# Patient Record
Sex: Female | Born: 1990 | Race: White | Hispanic: No | Marital: Single | Smoking: Never smoker
Health system: Southern US, Community
[De-identification: ages and names within clinical notes are randomized; demographics above are authoritative.]

## PROBLEM LIST (undated history)

## (undated) DIAGNOSIS — F419 Anxiety disorder, unspecified: Secondary | ICD-10-CM

## (undated) DIAGNOSIS — F1011 Alcohol abuse, in remission: Secondary | ICD-10-CM

## (undated) DIAGNOSIS — F329 Major depressive disorder, single episode, unspecified: Secondary | ICD-10-CM

## (undated) DIAGNOSIS — F32A Depression, unspecified: Secondary | ICD-10-CM

## (undated) DIAGNOSIS — F3181 Bipolar II disorder: Secondary | ICD-10-CM

## (undated) DIAGNOSIS — F1911 Other psychoactive substance abuse, in remission: Secondary | ICD-10-CM

## (undated) HISTORY — DX: Bipolar II disorder: F31.81

## (undated) HISTORY — PX: WISDOM TOOTH EXTRACTION: SHX21

## (undated) HISTORY — DX: Other psychoactive substance abuse, in remission: F19.11

## (undated) HISTORY — DX: Depression, unspecified: F32.A

## (undated) HISTORY — DX: Alcohol abuse, in remission: F10.11

## (undated) HISTORY — DX: Major depressive disorder, single episode, unspecified: F32.9

## (undated) HISTORY — DX: Anxiety disorder, unspecified: F41.9

---

## 2012-06-27 ENCOUNTER — Ambulatory Visit: Payer: Self-pay | Admitting: Women's Health

## 2012-07-11 ENCOUNTER — Encounter: Payer: Self-pay | Admitting: Women's Health

## 2012-07-11 ENCOUNTER — Ambulatory Visit (INDEPENDENT_AMBULATORY_CARE_PROVIDER_SITE_OTHER): Payer: BC Managed Care – PPO | Admitting: Women's Health

## 2012-07-11 VITALS — BP 124/76 | Ht 63.5 in | Wt 186.0 lb

## 2012-07-11 DIAGNOSIS — IMO0001 Reserved for inherently not codable concepts without codable children: Secondary | ICD-10-CM

## 2012-07-11 DIAGNOSIS — F32A Depression, unspecified: Secondary | ICD-10-CM

## 2012-07-11 DIAGNOSIS — F3289 Other specified depressive episodes: Secondary | ICD-10-CM

## 2012-07-11 DIAGNOSIS — N898 Other specified noninflammatory disorders of vagina: Secondary | ICD-10-CM

## 2012-07-11 DIAGNOSIS — E079 Disorder of thyroid, unspecified: Secondary | ICD-10-CM

## 2012-07-11 DIAGNOSIS — Z01419 Encounter for gynecological examination (general) (routine) without abnormal findings: Secondary | ICD-10-CM

## 2012-07-11 DIAGNOSIS — Z87440 Personal history of urinary (tract) infections: Secondary | ICD-10-CM

## 2012-07-11 DIAGNOSIS — N390 Urinary tract infection, site not specified: Secondary | ICD-10-CM

## 2012-07-11 DIAGNOSIS — F329 Major depressive disorder, single episode, unspecified: Secondary | ICD-10-CM | POA: Insufficient documentation

## 2012-07-11 DIAGNOSIS — Z309 Encounter for contraceptive management, unspecified: Secondary | ICD-10-CM

## 2012-07-11 LAB — CBC WITH DIFFERENTIAL/PLATELET
Basophils Absolute: 0 10*3/uL (ref 0.0–0.1)
Basophils Relative: 0 % (ref 0–1)
Eosinophils Absolute: 0.3 10*3/uL (ref 0.0–0.7)
Hemoglobin: 14 g/dL (ref 12.0–15.0)
MCH: 27.9 pg (ref 26.0–34.0)
MCHC: 34.1 g/dL (ref 30.0–36.0)
Monocytes Relative: 11 % (ref 3–12)
Neutrophils Relative %: 43 % (ref 43–77)
RDW: 13.2 % (ref 11.5–15.5)

## 2012-07-11 LAB — URINALYSIS W MICROSCOPIC + REFLEX CULTURE
Bilirubin Urine: NEGATIVE
Glucose, UA: NEGATIVE mg/dL
Hgb urine dipstick: NEGATIVE
Leukocytes, UA: NEGATIVE
Protein, ur: NEGATIVE mg/dL
Urobilinogen, UA: 0.2 mg/dL (ref 0.0–1.0)

## 2012-07-11 LAB — WET PREP FOR TRICH, YEAST, CLUE: Clue Cells Wet Prep HPF POC: NONE SEEN

## 2012-07-11 MED ORDER — FLUCONAZOLE 150 MG PO TABS: 150.0000 mg | ORAL_TABLET | Freq: Once | ORAL | Status: DC

## 2012-07-11 MED ORDER — NORETHINDRONE ACET-ETHINYL EST 1-20 MG-MCG PO TABS: ORAL_TABLET | ORAL | Status: DC

## 2012-07-11 MED ORDER — NITROFURANTOIN MACROCRYSTAL 50 MG PO CAPS: ORAL_CAPSULE | ORAL | Status: DC

## 2012-07-11 NOTE — Patient Instructions (Addendum)
Health Maintenance, 18- to 21-Year-Old SCHOOL PERFORMANCE After high school completion, the young adult may be attending college, technical or vocational school, or entering the military or the work force. SOCIAL AND EMOTIONAL DEVELOPMENT The young adult establishes adult relationships and explores sexual identity. Young adults may be living at home or in a college dorm or apartment. Increasing independence is important with young adults. Throughout adolescence, teens should assume responsibility of their own health care. IMMUNIZATIONS Most young adults should be fully vaccinated. A booster dose of Tdap (tetanus, diphtheria, and pertussis, or "whooping cough"), a dose of meningococcal vaccine to protect against a certain type of bacterial meningitis, hepatitis A, human papillomarvirus (HPV), chickenpox, or measles vaccines may be indicated, if not given at an earlier age. Annual influenza or "flu" vaccination should be considered during flu season.   TESTING Annual screening for vision and hearing problems is recommended. Vision should be screened objectively at least once between 18 and 21 years of age. The young adult may be screened for anemia or tuberculosis. Young adults should have a blood test to check for high cholesterol during this time period. Young adults should be screened for use of alcohol and drugs. If the young adult is sexually active, screening for sexually transmitted infections, pregnancy, or HIV may be performed. Screening for cervical cancer should be performed within 3 years of beginning sexual activity. NUTRITION AND ORAL HEALTH  Adequate calcium intake is important. Consume 3 servings of low-fat milk and dairy products daily. For those who do not drink milk or consume dairy products, calcium enriched foods, such as juice, bread, or cereal, dark, leafy greens, or canned fish are alternate sources of calcium.   Drink plenty of water. Limit fruit juice to 8 to 12 ounces per day.  Avoid sugary beverages or sodas.   Discourage skipping meals, especially breakfast. Teens should eat a good variety of vegetables and fruits, as well as lean meats.   Avoid high fat, high salt, and high sugar foods, such as candy, chips, and cookies.   Encourage young adults to participate in meal planning and preparation.   Eat meals together as a family whenever possible. Encourage conversation at mealtime.   Limit fast food choices and eating out at restaurants.   Brush teeth twice a day and floss.   Schedule dental exams twice a year.  SLEEP Regular sleep habits are important. PHYSICAL, SOCIAL, AND EMOTIONAL DEVELOPMENT  One hour of regular physical activity daily is recommended. Continue to participate in sports.   Encourage young adults to develop their own interests and consider community service or volunteerism.   Provide guidance to the young adult in making decisions about college and work plans.   Make sure that young adults know that they should never be in a situation that makes them uncomfortable, and they should tell partners if they do not want to engage in sexual activity.   Talk to the young adult about body image. Eating disorders may be noted at this time. Young adults may also be concerned about being overweight. Monitor the young adult for weight gain or loss.   Mood disturbances, depression, anxiety, alcoholism, or attention problems may be noted in young adults. Talk to the caregiver if there are concerns about mental illness.   Negotiate limit setting and independent decision making.   Encourage the young adult to handle conflict without physical violence.   Avoid loud noises which may impair hearing.   Limit television and computer time to 2 hours per   day. Individuals who engage in excessive sedentary activity are more likely to become overweight.  RISK BEHAVIORS  Sexually active young adults need to take precautions against pregnancy and sexually  transmitted infections. Talk to young adults about contraception.   Provide a tobacco-free and drug-free environment for the young adult. Talk to the young adult about drug, tobacco, and alcohol use among friends or at friends' homes. Make sure the young adult knows that smoking tobacco or marijuana and taking drugs have health consequences and may impact brain development.   Teach the young adult about appropriate use of over-the-counter or prescription medicines.   Establish guidelines for driving and for riding with friends.   Talk to young adults about the risks of drinking and driving or boating. Encourage the young adult to call you if he or she or friends have been drinking or using drugs.   Remind young adults to wear seat belts at all times in cars and life vests in boats.   Young adults should always wear a properly fitted helmet when they are riding a bicycle.   Use caution with all-terrain vehicles (ATVs) or other motorized vehicles.   Do not keep handguns in the home. (If you do, the gun and ammunition should be locked separately and out of the young adult's access.)   Equip your home with smoke detectors and change the batteries regularly. Make sure all family members know the fire escape plans for your home.   Teach young adults not to swim alone and not to dive in shallow water.   All individuals should wear sunscreen that protects against UVA and UVB light with at least a sun protection factor (SPF) of 30 when out in the sun. This minimizes sun burning.  WHAT'S NEXT? Young adults should visit their pediatrician or family physician yearly. By young adulthood, health care should be transitioned to a family physician or internal medicine specialist. Sexually active females may want to begin annual physical exams with a gynecologist. Document Released: 09/03/2006 Document Revised: 08/31/2011 Document Reviewed: 09/23/2006 ExitCare Patient Information 2013 [employer].    

## 2012-07-11 NOTE — Progress Notes (Signed)
Grace Russell [birth year] [id number]    History:    New patient presents for annual exam.  Regular monthly cycle on generess. Same partner. Gardasil series completed in high school. Currently seeing a counselor and psychiatrist for depression/anxiety. History of recurrent UTIs, associates with intercourse.   Past medical history, past surgical history, family history and social history were all reviewed and documented in the EPIC chart. Attends [employer] doing well academically.   ROS:  A  ROS was performed and pertinent positives and negatives are included in the history.  Exam:  Filed Vitals:   07/11/12 0812  BP: 124/76    General appearance:  Normal Head/Neck:  Normal, without cervical or supraclavicular adenopathy. Thyroid:  Symmetrical, normal in size, without palpable masses or nodularity. Respiratory  Effort:  Normal  Auscultation:  Clear without wheezing or rhonchi Cardiovascular  Auscultation:  Regular rate, without rubs, murmurs or gallops  Edema/varicosities:  Not grossly evident Abdominal  Soft,nontender, without masses, guarding or rebound.  Liver/spleen:  No organomegaly noted  Hernia:  None appreciated  Skin  Inspection:  Grossly normal  Palpation:  Grossly normal Neurologic/psychiatric  Orientation:  Normal with appropriate conversation.  Mood/affect:  Normal  Genitourinary    Breasts: Examined lying and sitting.     Right: Without masses, retractions, discharge or axillary adenopathy.     Left: Without masses, retractions, discharge or axillary adenopathy.   Inguinal/mons:  Normal without inguinal adenopathy  External genitalia:  Normal  BUS/Urethra/Skene's glands:  Normal  Bladder:  Normal  Vagina:  White curdy discharge wet prep positive for yeast  Cervix:  Normal  Uterus:   normal in size, shape and contour.  Midline and mobile  Adnexa/parametria:     Rt: Without masses or tenderness.   Lt: Without masses or tenderness.  Anus and  perineum: Normal   Assessment/Plan:  22 y.o.  S. [employer] G0 for annual exam.    Anxiety/depression-counselor and psychiatrist Contraception management Yeast Recurrent UTIs  Plan: Prescription for Loestrin 1/20 given, reviewed risks of blood clots and strokes. Encouraged condoms until permanent partner. Reviewed it is generic and should costs less, instructed to call if problems with change from generess. Diflucan 150 by mouth times one dose, prescription given these prevention discussed. UA negative, Macrodantin 50 mg with intercourse, prescription, proper use given and reviewed. SBE's, exercise, calcium rich diet, MVI daily, decrease calories for weight loss encouraged. CBC, TSH, UA, Pap screening guidelines reviewed.   Harrington Challenger WHNP, 11:12 AM 07/11/2012

## 2012-07-19 ENCOUNTER — Encounter: Payer: Self-pay | Admitting: Women's Health

## 2012-08-06 ENCOUNTER — Other Ambulatory Visit: Payer: Self-pay

## 2012-12-07 ENCOUNTER — Other Ambulatory Visit: Payer: Self-pay | Admitting: Women's Health

## 2012-12-07 ENCOUNTER — Telehealth: Payer: Self-pay | Admitting: *Deleted

## 2012-12-07 DIAGNOSIS — Z23 Encounter for immunization: Secondary | ICD-10-CM

## 2012-12-07 NOTE — Telephone Encounter (Signed)
Pt will be leaving out of the county [place] 25 days and is requesting a typhoid Rx, [facility] but she will need a rx for this. Pt asked if you would be willing to give? She tried the health department and they don't have it. I'm not sure how to place order in epic. Please advise

## 2012-12-07 NOTE — Telephone Encounter (Signed)
Please call patient, [facility] she will take one tablet every other day for 4 doses, they have in stock. It is a live vaccine, keep refrigerated. Continue on birth control pills while on this. Where is she going?

## 2012-12-07 NOTE — Telephone Encounter (Signed)
Pt informed with the below note, pt will be traveling to [place].

## 2012-12-20 ENCOUNTER — Telehealth: Payer: Self-pay | Admitting: *Deleted

## 2012-12-20 NOTE — Telephone Encounter (Signed)
Typhim Vi.  One capsule on alternate days (day 1, 3, 5, and 7) for a total of 4 doses

## 2012-12-20 NOTE — Telephone Encounter (Signed)
RX CALLED IN SPOKE WITH CINDY, LEFT MESSAGE ON PT VOICEMAIL RX SENT.

## 2012-12-20 NOTE — Telephone Encounter (Signed)
Okay to call in. 

## 2012-12-20 NOTE — Telephone Encounter (Signed)
YOU ARE THE BACK UP MD) SEE TELEPHONE ENCOUNTER 12/07/12. PT CALLED AND SAID RX IS NOT THERE, I CALLED PHARMACY AS WELL AND NO RX IS THERE OR [facility]. PLEASE ADVISE ANY RECOMMENDATIONS.

## 2012-12-20 NOTE — Telephone Encounter (Signed)
NOT SURE WHAT IM CALLING IN? THERE IS NONETHING IN DOCUMENTATION ABOUT THE NAME OF MEDICATION NOR DOSE.  I WILL CALL IN RX, I JUST NEED TO KNOW WHAT TO CALL IN FOR THE PATIENT.

## 2013-04-27 ENCOUNTER — Other Ambulatory Visit: Payer: Self-pay

## 2013-04-30 ENCOUNTER — Other Ambulatory Visit: Payer: Self-pay | Admitting: Women's Health

## 2013-05-01 NOTE — Telephone Encounter (Signed)
Will contact to schedule annual KW

## 2013-07-13 ENCOUNTER — Ambulatory Visit (INDEPENDENT_AMBULATORY_CARE_PROVIDER_SITE_OTHER): Payer: BC Managed Care – PPO | Admitting: Women's Health

## 2013-07-13 ENCOUNTER — Encounter: Payer: Self-pay | Admitting: Women's Health

## 2013-07-13 VITALS — BP 116/76 | Ht 63.75 in | Wt 206.6 lb

## 2013-07-13 DIAGNOSIS — Z01419 Encounter for gynecological examination (general) (routine) without abnormal findings: Secondary | ICD-10-CM

## 2013-07-13 DIAGNOSIS — IMO0001 Reserved for inherently not codable concepts without codable children: Secondary | ICD-10-CM

## 2013-07-13 DIAGNOSIS — R300 Dysuria: Secondary | ICD-10-CM

## 2013-07-13 DIAGNOSIS — Z833 Family history of diabetes mellitus: Secondary | ICD-10-CM

## 2013-07-13 DIAGNOSIS — N390 Urinary tract infection, site not specified: Secondary | ICD-10-CM

## 2013-07-13 DIAGNOSIS — Z309 Encounter for contraceptive management, unspecified: Secondary | ICD-10-CM

## 2013-07-13 DIAGNOSIS — Z113 Encounter for screening for infections with a predominantly sexual mode of transmission: Secondary | ICD-10-CM

## 2013-07-13 DIAGNOSIS — E079 Disorder of thyroid, unspecified: Secondary | ICD-10-CM

## 2013-07-13 LAB — CBC WITH DIFFERENTIAL/PLATELET
BASOS ABS: 0 10*3/uL (ref 0.0–0.1)
BASOS PCT: 0 % (ref 0–1)
EOS ABS: 0.1 10*3/uL (ref 0.0–0.7)
Eosinophils Relative: 2 % (ref 0–5)
HCT: 41.6 % (ref 36.0–46.0)
HEMOGLOBIN: 14 g/dL (ref 12.0–15.0)
Lymphocytes Relative: 39 % (ref 12–46)
Lymphs Abs: 2.1 10*3/uL (ref 0.7–4.0)
MCH: 28.2 pg (ref 26.0–34.0)
MCHC: 33.7 g/dL (ref 30.0–36.0)
MCV: 83.9 fL (ref 78.0–100.0)
MONO ABS: 0.5 10*3/uL (ref 0.1–1.0)
Monocytes Relative: 9 % (ref 3–12)
NEUTROS PCT: 50 % (ref 43–77)
Neutro Abs: 2.8 10*3/uL (ref 1.7–7.7)
Platelets: 317 10*3/uL (ref 150–400)
RBC: 4.96 MIL/uL (ref 3.87–5.11)
RDW: 12.9 % (ref 11.5–15.5)
WBC: 5.5 10*3/uL (ref 4.0–10.5)

## 2013-07-13 LAB — RPR

## 2013-07-13 LAB — HEPATITIS B SURFACE ANTIGEN: Hepatitis B Surface Ag: NEGATIVE

## 2013-07-13 LAB — TSH: TSH: 2.421 u[IU]/mL (ref 0.350–4.500)

## 2013-07-13 LAB — HEPATITIS C ANTIBODY: HCV Ab: NEGATIVE

## 2013-07-13 LAB — HIV ANTIBODY (ROUTINE TESTING W REFLEX): HIV: NONREACTIVE

## 2013-07-13 LAB — GLUCOSE, RANDOM: Glucose, Bld: 87 mg/dL (ref 70–99)

## 2013-07-13 MED ORDER — NITROFURANTOIN MACROCRYSTAL 50 MG PO CAPS: ORAL_CAPSULE | ORAL | Status: DC

## 2013-07-13 MED ORDER — NORETHINDRONE ACET-ETHINYL EST 1-20 MG-MCG PO TABS: ORAL_TABLET | ORAL | Status: DC

## 2013-07-13 NOTE — Patient Instructions (Signed)
Health Maintenance, 65- to 21-Year-Old SCHOOL PERFORMANCE After high school completion, the young adult may be attending college, Hotel manager or vocational school, or entering the [employer] or the work force. SOCIAL AND EMOTIONAL DEVELOPMENT The young adult establishes adult relationships and explores sexual identity. Young adults may be living at home or in a college dorm or apartment. Increasing independence is important with young adults. Throughout these years, young adults should assume responsibility of their own health care. RECOMMENDED IMMUNIZATIONS  Influenza vaccine.  All adults should be immunized every year.  All adults, including pregnant women and people with hives-only allergy to eggs can receive the inactivated influenza (IIV) vaccine.  Adults aged 29 49 years can receive the recombinant influenza (RIV) vaccine. The RIV vaccine does not contain any egg protein.  Tetanus, diphtheria, and acellular pertussis (Td, Tdap) vaccine.  Pregnant women should receive 1 dose of Tdap vaccine during each pregnancy. The dose should be obtained regardless of the length of time since the last dose. Immunization is preferred during the 27th to 36th week of gestation.  An adult who has not previously received Tdap or who does not know his or her vaccine status should receive 1 dose of Tdap. This initial dose should be followed by tetanus and diphtheria toxoids (Td) booster doses every 10 years.  Adults with an unknown or incomplete history of completing a 3-dose immunization series with Td-containing vaccines should begin or complete a primary immunization series including a Tdap dose.  Adults should receive a Td booster every 10 years.  Varicella vaccine.  An adult without evidence of immunity to varicella should receive 2 doses or a second dose if he or she has previously received 1 dose.  Pregnant females who do not have evidence of immunity should receive the first dose after pregnancy.  This first dose should be obtained before leaving the health care facility. The second dose should be obtained 4 8 weeks after the first dose.  Human papillomavirus (HPV) vaccine.  Females aged 55 26 years who have not received the vaccine previously should obtain the 3-dose series.  The vaccine is not recommended for use in pregnant females. However, pregnancy testing is not needed before receiving a dose. If a female is found to be pregnant after receiving a dose, no treatment is needed. In that case, the remaining doses should be delayed until after the pregnancy.  Males aged 30 21 years who have not received the vaccine previously should receive the 3-dose series. Males aged 80 26 years may be immunized.  Immunization is recommended through the age of 52 years for any female who has sex with males and did not get any or all doses earlier.  Immunization is recommended for any person with an immunocompromised condition through the age of 8 years if he or she did not get any or all doses earlier.  During the 3-dose series, the second dose should be obtained 4 8 weeks after the first dose. The third dose should be obtained 24 weeks after the first dose and 16 weeks after the second dose.  Measles, mumps, and rubella (MMR) vaccine.  Adults born in [birth year] or later should have 1 or more doses of MMR vaccine unless there is a contraindication to the vaccine or there is laboratory evidence of immunity to each of the three diseases.  A routine second dose of MMR vaccine should be obtained at least 28 days after the first dose for students attending postsecondary schools, health care workers, or international travelers.  For females of childbearing age, rubella immunity should be determined. If there is no evidence of immunity, females who are not pregnant should be vaccinated. If there is no evidence of immunity, females who are pregnant should delay immunization until after pregnancy.  Pneumococcal  13-valent conjugate (PCV13) vaccine.  When indicated, a person who is uncertain of his or her immunization history and has no record of immunization should receive the PCV13 vaccine.  An adult aged 49 years or older who has certain medical conditions and has not been previously immunized should receive 1 dose of PCV13 vaccine. This PCV13 should be followed with a dose of pneumococcal polysaccharide (PPSV23) vaccine. The PPSV23 vaccine dose should be obtained at least 8 weeks after the dose of PCV13 vaccine.  An adult aged 21 years or older who has certain medical conditions and previously received 1 or more doses of PPSV23 vaccine should receive 1 dose of PCV13. The PCV13 vaccine dose should be obtained 1 or more years after the last PPSV23 vaccine dose.  Pneumococcal polysaccharide (PPSV23) vaccine.  When PCV13 is also indicated, PCV13 should be obtained first.  An adult younger than age 69 years who has certain medical conditions should be immunized.  Any person who resides in a nursing home or long-term care facility should be immunized.  An adult smoker should be immunized.  People with an immunocompromised condition and certain other conditions should receive both PCV13 and PPSV23 vaccines.  People with human immunodeficiency virus (HIV) infection should be immunized as soon as possible after diagnosis.  Immunization during chemotherapy or radiation therapy should be avoided.  Routine use of PPSV23 vaccine is not recommended for American Indians, [place] Natives, or people younger than 65 years unless there are medical conditions that require PPSV23 vaccine.  When indicated, people who have unknown immunization and have no record of immunization should receive PPSV23 vaccine.  One-time revaccination 5 years after the first dose of PPSV23 is recommended for people aged 86 64 years who have chronic kidney failure, nephrotic syndrome, asplenia, or immunocompromised  conditions.  Meningococcal vaccine.  Adults with asplenia or persistent complement component deficiencies should receive 2 doses of quadrivalent meningococcal conjugate (MenACWY-D) vaccine. The doses should be obtained at least 2 months apart.  Microbiologists working with certain meningococcal bacteria, [employer] recruits, people at risk during an outbreak, and people who travel to or live in countries with a high rate of meningitis should be immunized.  A first-year college student up through age 50 years who is living in a residence hall should receive a dose if he or she did not receive a dose on or after his or her 16th birthday.  Adults who have certain high-risk conditions should receive one or more doses of vaccine.  Hepatitis A vaccine.  Adults who wish to be protected from this disease, have certain high-risk conditions, work with hepatitis A-infected animals, work in hepatitis A research labs, or travel to or work in countries with a high rate of hepatitis A should be immunized.  Adults who were previously unvaccinated and who anticipate close contact with an international adoptee during the first 60 days after arrival in the [place] from a country with a high rate of hepatitis A should be immunized.  Hepatitis B vaccine.  Adults who wish to be protected from this disease, have certain high-risk conditions, may be exposed to blood or other infectious body fluids, are household contacts or sex partners of hepatitis B positive people, are clients or workers in  certain care facilities, or travel to or work in countries with a high rate of hepatitis B should be immunized.  Haemophilus influenzae type b (Hib) vaccine.  A previously unvaccinated person with asplenia or sickle cell disease or having a scheduled splenectomy should receive 1 dose of Hib vaccine.  Regardless of previous immunization, a recipient of a hematopoietic stem cell transplant should receive a 3-dose series 6  12 months after his or her successful transplant.  Hib vaccine is not recommended for adults with HIV infection. TESTING Annual screening for vision and hearing problems is recommended. Vision should be screened objectively at least once between 18 21 years of age. The young adult may be screened for anemia or tuberculosis. Young adults should have a blood test to check for high cholesterol during this time period. Young adults should be screened for use of alcohol and drugs. If the young adult is sexually active, screening for sexually transmitted infections, pregnancy, or HIV may be performed.  NUTRITION AND ORAL HEALTH  Adequate calcium intake is important. Consume 3 servings of low-fat milk and dairy products daily. For those who do not drink milk or consume dairy products, calcium enriched foods, such as juice, bread, or cereal, dark, leafy greens, or canned fish are alternate sources of calcium.  Drink plenty of water. Limit fruit juice to 8 12 ounces (240 360 mL) each day. Avoid sugary beverages or sodas.  Discourage skipping meals, especially breakfast. Young adults should eat a good variety of vegetables and fruits, as well as lean meats.  Avoid foods high in fat, salt, or sugar, such as candy, chips, and cookies.  Encourage young adults to participate in meal planning and preparation.  Eat meals together as a family whenever possible. Encourage conversation at mealtime.  Limit fast food choices and eating out at restaurants.  Brush teeth twice a day and floss.  Schedule dental exams twice a year. SLEEP Regular sleep habits are important. PHYSICAL, SOCIAL, AND EMOTIONAL DEVELOPMENT  One hour of regular physical activity daily is recommended. Continue to participate in sports.  Encourage young adults to develop their own interests and consider community service or volunteerism.  Provide guidance to the young adult in making decisions about college and work plans.  Make sure  that young adults know that they should never be in a situation that makes them uncomfortable, and they should tell partners if they do not want to engage in sexual activity.  Talk to the young adult about body image. Eating disorders may be noted at this time. Young adults may also be concerned about being overweight. Monitor the young adult for weight gain or loss.  Mood disturbances, depression, anxiety, alcoholism, or attention problems may be noted in young adults. Talk to the caregiver if there are concerns about mental illness.  Negotiate limit setting and independent decision making.  Encourage the young adult to handle conflict without physical violence.  Avoid loud noises which may impair hearing.  Limit television and computer time to 2 hours each day. Individuals who engage in excessive sedentary activity are more likely to become overweight. RISK BEHAVIORS  Sexually active young adults need to take precautions against pregnancy and sexually transmitted infections. Talk to young adults about contraception.  Provide a tobacco-free and drug-free environment for the young adult. Talk to the young adult about drug, tobacco, and alcohol use among friends or at friend's homes. Make sure the young adult knows that smoking tobacco or marijuana and taking drugs have health consequences and   may impact brain development.  Teach the young adult about appropriate use of over-the-counter or prescription medicines.  Establish guidelines for driving and for riding with friends.  Talk to young adults about the risks of drinking and driving or boating. Encourage the young adult to call you if he or she or friends have been drinking or using drugs.  Remind young adults to wear seat belts at all times in cars and life vests in boats.  Young adults should always wear a properly fitted helmet when they are riding a bicycle.  Use caution with all-terrain vehicles (ATVs) or other motorized  vehicles.  Do not keep handguns in the home. (If you do, the gun and ammunition should be locked separately and out of the young adult's access.)  Equip your home with smoke detectors and change the batteries regularly. Make sure all family members know the fire escape plans for your home.  Teach young adults not to swim alone and not to dive in shallow water.  All individuals should wear sunscreen when out in the sun. This minimizes sunburning. WHAT'S NEXT? Young adults should visit their pediatrician or family physician yearly. By young adulthood, health care should be transitioned to a family physician or internal medicine specialist. Sexually active females may want to begin annual physical exams with a gynecologist. Document Released: 09/03/2006 Document Revised: 10/03/2012 Document Reviewed: 09/23/2006 Exit[employer] Patient Information 2014 [employer].  

## 2013-07-13 NOTE — Progress Notes (Signed)
Grace CourseBettina Russell Dec 30, 1990 [id number]   History:    Presents for annual exam with complaint of urinary odor and occasional dysuria. Denies other urinary/vaginal symptoms/ fever, N/V. Normal monthly cycle/continuous Junel 1/20/new partner.  Requests STD screening. History of UTIs, takes Macrodantin 50 mg postcoital. Gardasil series completed in high school. History of depression/anxiety, not currently on meds or in counseling. 20 pound weight gain from last year.  Past medical history, past surgical history, family history and social history were all reviewed and documented in the EPIC chart. [facility] [employer] [facility] [employer]vocal [employer]. Father diabetes, heart disease. PGF heart disease.  ROS:  A  ROS was performed and pertinent positives and negatives are included.  Exam:  Filed Vitals:   07/13/13 1405  BP: 116/76    General appearance:  Flat affect Thyroid:  Symmetrical, normal in size, without palpable masses or nodularity. Respiratory  Auscultation:  Clear without wheezing or rhonchi Cardiovascular  Auscultation:  Regular rate, without rubs, murmurs or gallops  Edema/varicosities:  Not grossly evident Abdominal  Soft,nontender, without masses, guarding or rebound.  Liver/spleen:  No organomegaly noted  Hernia:  None appreciated  Skin  Inspection:  Grossly normal   Breasts: Examined lying and sitting.     Right: Without masses, retractions, discharge or axillary adenopathy.     Left: Without masses, retractions, discharge or axillary adenopathy. Gentitourinary   Inguinal/mons:  Normal without inguinal adenopathy  External genitalia:  Normal  BUS/Urethra/Skene's glands:  Normal  Vagina:  Normal  Cervix:  Normal  Uterus:   normal in size, shape and contour.  Midline and mobile  Adnexa/parametria:     Rt: Without masses or tenderness.   Lt: Without masses or tenderness.  Anus and perineum: Normal  Digital rectal exam: Normal sphincter tone without palpated masses or  tenderness  Assessment/Plan:  23 y.o. SWF G0 for annual exam with complaint of urinary odor and occasional dysuria.  Normal monthly cycle/continuous Junel 1/20 History frequent UTIs with relief with Macrodantin Depression/anxiety currently on no medication Obesity  Plan: Junel 1/20 given, prescription, proper use given and reviewed risks of blood clots and strokes. Macrodantin 50 mg by mouth postcoital. Prescription proper use given and reviewed. CBC, TSH, glucose, UA, GC/Chlamydia, Hep B, Hep C, HIV, RPR.  Encouraged to find new counselor for anxiety/depression Encouraged condoms until permanent partner. SBE's, exercise, calcium rich diet, MVI daily, decrease calories for weight loss (20 lb weight gain from last year) encouraged. Safe driving, dating reviewed.    Harrington ChallengerYOUNG,Norinne Jeane J [facility] CenterWHNP, 2:45 PM 07/13/2013

## 2013-07-14 LAB — URINALYSIS W MICROSCOPIC + REFLEX CULTURE
BACTERIA UA: NONE SEEN
BILIRUBIN URINE: NEGATIVE
CASTS: NONE SEEN
GLUCOSE, UA: NEGATIVE mg/dL
HGB URINE DIPSTICK: NEGATIVE
KETONES UR: NEGATIVE mg/dL
Leukocytes, UA: NEGATIVE
Nitrite: POSITIVE — AB
PH: 5.5 (ref 5.0–8.0)
PROTEIN: NEGATIVE mg/dL
Specific Gravity, Urine: 1.028 (ref 1.005–1.030)
Urobilinogen, UA: 0.2 mg/dL (ref 0.0–1.0)

## 2013-07-14 LAB — GC/CHLAMYDIA PROBE AMP
CT Probe RNA: NEGATIVE
GC Probe RNA: NEGATIVE

## 2013-07-15 LAB — URINE CULTURE

## 2014-01-03 ENCOUNTER — Ambulatory Visit: Payer: BC Managed Care – PPO | Admitting: Women's Health

## 2014-01-26 ENCOUNTER — Encounter: Payer: Self-pay | Admitting: Women's Health

## 2014-01-26 ENCOUNTER — Ambulatory Visit (INDEPENDENT_AMBULATORY_CARE_PROVIDER_SITE_OTHER): Payer: BC Managed Care – PPO | Admitting: Women's Health

## 2014-01-26 DIAGNOSIS — N3000 Acute cystitis without hematuria: Secondary | ICD-10-CM

## 2014-01-26 DIAGNOSIS — Z113 Encounter for screening for infections with a predominantly sexual mode of transmission: Secondary | ICD-10-CM

## 2014-01-26 DIAGNOSIS — B9689 Other specified bacterial agents as the cause of diseases classified elsewhere: Secondary | ICD-10-CM

## 2014-01-26 DIAGNOSIS — N760 Acute vaginitis: Secondary | ICD-10-CM

## 2014-01-26 DIAGNOSIS — A499 Bacterial infection, unspecified: Secondary | ICD-10-CM

## 2014-01-26 LAB — URINALYSIS W MICROSCOPIC + REFLEX CULTURE
BILIRUBIN URINE: NEGATIVE
Crystals: NONE SEEN
Glucose, UA: NEGATIVE mg/dL
Hgb urine dipstick: NEGATIVE
KETONES UR: NEGATIVE mg/dL
Leukocytes, UA: NEGATIVE
Nitrite: POSITIVE — AB
PROTEIN: NEGATIVE mg/dL
RBC / HPF: NONE SEEN RBC/hpf (ref <3)
Specific Gravity, Urine: 1.02 (ref 1.005–1.030)
UROBILINOGEN UA: 0.2 mg/dL (ref 0.0–1.0)
pH: 6.5 (ref 5.0–8.0)

## 2014-01-26 LAB — WET PREP FOR TRICH, YEAST, CLUE
TRICH WET PREP: NONE SEEN
Yeast Wet Prep HPF POC: NONE SEEN

## 2014-01-26 MED ORDER — METRONIDAZOLE 500 MG PO TABS: 500.0000 mg | ORAL_TABLET | Freq: Two times a day (BID) | ORAL | Status: DC

## 2014-01-26 NOTE — Progress Notes (Signed)
Patient ID: Grace Russell, female   DOB: Jul 28, 1990, 23 y.o.   MRN: [id number] Presents with complaint of questionable UTI,  vaginal discharge with odor. Reports increased urinary frequency, pain, nocturia. Has been using Macrodantin 50 mg after intercourse and states has had 3 UTIs in the past 6 months, none here, treated at urgent care. New partner. Denies abdominal pain, fever. Had been on OC's and stopped.  Exam: Appears well. External genitalia within normal limits, speculum exam scant white adherent discharge noted, wet prep positive for clues, and TNTC bacteria. GC/Chlamydia culture taken. Bimanual no CMT or adnexal fullness or tenderness some tenderness in suprapubic area. UA: Positive for nitrates, 0-2 wbc's, many bacteria.  Bacteria vaginosis Contraception management Urinary symptoms  Plan: Urine culture pending. Flagyl 500 twice daily for 7 days, alcohol precautions reviewed. Contraception options reviewed, Mirena IUD information given and reviewed slight risk for perforation, hemorrhage infection. Return to office for Dr. Audie BoxFontaine to place with cycle will check for insurance coverage.

## 2014-01-26 NOTE — Patient Instructions (Signed)
Levonorgestrel intrauterine device (IUD) What is this medicine? LEVONORGESTREL IUD (LEE voe nor jes trel) is a contraceptive (birth control) device. The device is placed inside the uterus by a healthcare professional. It is used to prevent pregnancy and can also be used to treat heavy bleeding that occurs during your period. Depending on the device, it can be used for 3 to 5 years. This medicine may be used for other purposes; ask your health care provider or pharmacist if you have questions. COMMON BRAND NAME(S): LILETTA, Mirena, Skyla What should I tell my health care provider before I take this medicine? They need to know if you have any of these conditions: -abnormal Pap smear -cancer of the breast, uterus, or cervix -diabetes -endometritis -genital or pelvic infection now or in the past -have more than one sexual partner or your partner has more than one partner -heart disease -history of an ectopic or tubal pregnancy -immune system problems -IUD in place -liver disease or tumor -problems with blood clots or take blood-thinners -use intravenous drugs -uterus of unusual shape -vaginal bleeding that has not been explained -an unusual or allergic reaction to levonorgestrel, other hormones, silicone, or polyethylene, medicines, foods, dyes, or preservatives -pregnant or trying to get pregnant -breast-feeding How should I use this medicine? This device is placed inside the uterus by a health care professional. Talk to your pediatrician regarding the use of this medicine in children. Special care may be needed. Overdosage: If you think you have taken too much of this medicine contact a poison control center or emergency room at once. NOTE: This medicine is only for you. Do not share this medicine with others. What if I miss a dose? This does not apply. What may interact with this medicine? Do not take this medicine with any of the following  medications: -amprenavir -bosentan -fosamprenavir This medicine may also interact with the following medications: -aprepitant -barbiturate medicines for inducing sleep or treating seizures -bexarotene -griseofulvin -medicines to treat seizures like carbamazepine, ethotoin, felbamate, oxcarbazepine, phenytoin, topiramate -modafinil -pioglitazone -rifabutin -rifampin -rifapentine -some medicines to treat HIV infection like atazanavir, indinavir, lopinavir, nelfinavir, tipranavir, ritonavir -St. John's wort -warfarin This list may not describe all possible interactions. Give your health care provider a list of all the medicines, herbs, non-prescription drugs, or dietary supplements you use. Also tell them if you smoke, drink alcohol, or use illegal drugs. Some items may interact with your medicine. What should I watch for while using this medicine? Visit your doctor or health care professional for regular check ups. See your doctor if you or your partner has sexual contact with others, becomes HIV positive, or gets a sexual transmitted disease. This product does not protect you against HIV infection (AIDS) or other sexually transmitted diseases. You can check the placement of the IUD yourself by reaching up to the top of your vagina with clean fingers to feel the threads. Do not pull on the threads. It is a good habit to check placement after each menstrual period. Call your doctor right away if you feel more of the IUD than just the threads or if you cannot feel the threads at all. The IUD may come out by itself. You may become pregnant if the device comes out. If you notice that the IUD has come out use a backup birth control method like condoms and call your health care provider. Using tampons will not change the position of the IUD and are okay to use during your period. What side effects may   I notice from receiving this medicine? Side effects that you should report to your doctor or  health care professional as soon as possible: -allergic reactions like skin rash, itching or hives, swelling of the face, lips, or tongue -fever, flu-like symptoms -genital sores -high blood pressure -no menstrual period for 6 weeks during use -pain, swelling, warmth in the leg -pelvic pain or tenderness -severe or sudden headache -signs of pregnancy -stomach cramping -sudden shortness of breath -trouble with balance, talking, or walking -unusual vaginal bleeding, discharge -yellowing of the eyes or skin Side effects that usually do not require medical attention (report to your doctor or health care professional if they continue or are bothersome): -acne -breast pain -change in sex drive or performance -changes in weight -cramping, dizziness, or faintness while the device is being inserted -headache -irregular menstrual bleeding within first 3 to 6 months of use -nausea This list may not describe all possible side effects. Call your doctor for medical advice about side effects. You may report side effects to FDA at 1-800-FDA-1088. Where should I keep my medicine? This does not apply. NOTE: This sheet is a summary. It may not cover all possible information. If you have questions about this medicine, talk to your doctor, pharmacist, or health care provider.  2015, [employer]vier/Gold Standard. (2011-07-09 13:54:04)  

## 2014-01-27 LAB — RPR

## 2014-01-27 LAB — GC/CHLAMYDIA PROBE AMP
CT PROBE, AMP APTIMA: NEGATIVE
GC PROBE AMP APTIMA: NEGATIVE

## 2014-01-27 LAB — HEPATITIS B SURFACE ANTIGEN: Hepatitis B Surface Ag: NEGATIVE

## 2014-01-27 LAB — HIV ANTIBODY (ROUTINE TESTING W REFLEX): HIV: NONREACTIVE

## 2014-01-27 LAB — HEPATITIS C ANTIBODY: HCV AB: NEGATIVE

## 2014-01-28 LAB — URINE CULTURE: Colony Count: >=100000

## 2014-01-29 ENCOUNTER — Other Ambulatory Visit: Payer: Self-pay | Admitting: Women's Health

## 2014-01-29 DIAGNOSIS — N3001 Acute cystitis with hematuria: Secondary | ICD-10-CM

## 2014-01-29 MED ORDER — CIPROFLOXACIN HCL 250 MG PO TABS: 250.0000 mg | ORAL_TABLET | Freq: Two times a day (BID) | ORAL | Status: DC

## 2014-01-31 ENCOUNTER — Other Ambulatory Visit: Payer: Self-pay | Admitting: Gynecology

## 2014-01-31 ENCOUNTER — Telehealth: Payer: Self-pay | Admitting: Gynecology

## 2014-01-31 DIAGNOSIS — Z3049 Encounter for surveillance of other contraceptives: Secondary | ICD-10-CM

## 2014-01-31 MED ORDER — LEVONORGESTREL 20 MCG/24HR IU IUD: INTRAUTERINE_SYSTEM | Freq: Once | INTRAUTERINE | Status: AC

## 2014-01-31 NOTE — Telephone Encounter (Signed)
01/31/14-LM VM for pt [facility] Mirena & insertion under her $50.00 copay/wl

## 2014-02-02 ENCOUNTER — Ambulatory Visit: Payer: BC Managed Care – PPO | Admitting: Gynecology

## 2014-02-02 ENCOUNTER — Telehealth: Payer: Self-pay | Admitting: *Deleted

## 2014-02-02 NOTE — Telephone Encounter (Signed)
Yes until she started as her menstrual cycle

## 2014-02-02 NOTE — Telephone Encounter (Signed)
Pt scheduled to have Mirena Iud placed today at 2:00 pm. Cycle has not yet started, stopped taking BCP back in April, uses condoms but condom broke several weeks ago. LMP 12/29/13. Would you like patient to reschedule appointment? Please advise

## 2014-02-02 NOTE — Telephone Encounter (Signed)
Pt will be informed. 

## 2014-06-01 ENCOUNTER — Telehealth: Payer: Self-pay | Admitting: *Deleted

## 2014-06-01 DIAGNOSIS — N390 Urinary tract infection, site not specified: Secondary | ICD-10-CM

## 2014-06-01 MED ORDER — NITROFURANTOIN MACROCRYSTAL 50 MG PO CAPS: ORAL_CAPSULE | ORAL | Status: DC

## 2014-06-01 NOTE — Telephone Encounter (Signed)
Okay, Macrodantin 50 mg as needed with intercourse #30 no refills.

## 2014-06-01 NOTE — Telephone Encounter (Signed)
Pt called requesting refill on macrodantin 50 mg PRN postcoital. Please advise

## 2014-06-01 NOTE — Telephone Encounter (Signed)
rx sent, pt aware 

## 2014-07-20 ENCOUNTER — Encounter: Payer: BC Managed Care – PPO | Admitting: Women's Health

## 2015-03-27 ENCOUNTER — Encounter: Payer: Self-pay | Admitting: Women's Health

## 2015-03-27 ENCOUNTER — Ambulatory Visit (INDEPENDENT_AMBULATORY_CARE_PROVIDER_SITE_OTHER): Payer: Commercial Managed Care - HMO | Admitting: Women's Health

## 2015-03-27 ENCOUNTER — Other Ambulatory Visit (HOSPITAL_COMMUNITY)
Admission: RE | Admit: 2015-03-27 | Discharge: 2015-03-27 | Disposition: A | Discharge status: Home / Self Care | Payer: Self-pay | Source: Ambulatory Visit | Attending: Gynecology | Admitting: Gynecology

## 2015-03-27 VITALS — BP 120/78 | Ht 65.0 in | Wt 235.0 lb

## 2015-03-27 DIAGNOSIS — Z975 Presence of (intrauterine) contraceptive device: Secondary | ICD-10-CM | POA: Diagnosis not present

## 2015-03-27 DIAGNOSIS — N390 Urinary tract infection, site not specified: Secondary | ICD-10-CM | POA: Diagnosis not present

## 2015-03-27 DIAGNOSIS — Z113 Encounter for screening for infections with a predominantly sexual mode of transmission: Secondary | ICD-10-CM | POA: Diagnosis not present

## 2015-03-27 DIAGNOSIS — Z01419 Encounter for gynecological examination (general) (routine) without abnormal findings: Secondary | ICD-10-CM

## 2015-03-27 LAB — CBC WITH DIFFERENTIAL/PLATELET
Basophils Absolute: 0 10*3/uL (ref 0.0–0.1)
Basophils Relative: 0 % (ref 0–1)
EOS PCT: 5 % (ref 0–5)
Eosinophils Absolute: 0.4 10*3/uL (ref 0.0–0.7)
HEMATOCRIT: 41.4 % (ref 36.0–46.0)
Hemoglobin: 14 g/dL (ref 12.0–15.0)
LYMPHS PCT: 27 % (ref 12–46)
Lymphs Abs: 1.9 10*3/uL (ref 0.7–4.0)
MCH: 28.6 pg (ref 26.0–34.0)
MCHC: 33.8 g/dL (ref 30.0–36.0)
MCV: 84.7 fL (ref 78.0–100.0)
MONO ABS: 0.8 10*3/uL (ref 0.1–1.0)
MPV: 9.3 fL (ref 8.6–12.4)
Monocytes Relative: 11 % (ref 3–12)
Neutro Abs: 4.1 10*3/uL (ref 1.7–7.7)
Neutrophils Relative %: 57 % (ref 43–77)
Platelets: 371 10*3/uL (ref 150–400)
RBC: 4.89 MIL/uL (ref 3.87–5.11)
RDW: 13.1 % (ref 11.5–15.5)
WBC: 7.2 10*3/uL (ref 4.0–10.5)

## 2015-03-27 LAB — TSH: TSH: 2.575 u[IU]/mL (ref 0.350–4.500)

## 2015-03-27 LAB — GLUCOSE, RANDOM: Glucose, Bld: 74 mg/dL (ref 65–99)

## 2015-03-27 MED ORDER — NITROFURANTOIN MACROCRYSTAL 50 MG PO CAPS: ORAL_CAPSULE | ORAL | Status: DC

## 2015-03-27 NOTE — Progress Notes (Signed)
Patient ID: Grace Russell, female   DOB: 1991-01-25, 24 y.o.   MRN: [id number] Tayonna Bacha [birth year] [id number]    History:    Presents for annual exam.  She has concern that something might be wrong with her Mirena because she has had two episodes of spotting, [facility]  She is newly engaged and uses condoms intermittently.  Denies discharge, itching, and odor.  Does not exercise and sometimes eats poorly.  Has completed gardasil series.  Is struggling with bipolar disorder and having trouble finding a psychiatrist in the area.  She is feeling very depressed lately, but has been abstaining from alcohol. History of suicide attempt with Adderall and alcohol combination.  Past medical history, past surgical history, family history and social history were all reviewed and documented in the EPIC chart.   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  She met her fiance on vacation in [place] and he was living in [place], but his visa expired and he had to go back to [place].  Her father has diabetes, heart failure, and is awaiting a heart transplant.  Mother has unknown psych history and both parents have a history of substance abuse.    ROS:  A ROS was performed and pertinent positives and negatives are included.  Exam:  Filed Vitals:   03/27/15 1402  BP: 120/78    General appearance:  Normal, flat affect Thyroid:  Symmetrical, normal in size, without palpable masses or nodularity. Respiratory  Auscultation:  Clear without wheezing or rhonchi Cardiovascular  Auscultation:  Regular rate, without rubs, murmurs or gallops  Edema/varicosities:  Not grossly evident Abdominal  Soft,nontender, without masses, guarding or rebound.  Liver/spleen:  No organomegaly noted  Hernia:  None appreciated  Skin  Inspection:  Grossly normal   Breasts: Examined lying and sitting.     Right: Without masses, retractions, discharge or  axillary adenopathy.     Left: Without masses, retractions, discharge or axillary adenopathy. Gentitourinary   Inguinal/mons:  Normal without inguinal adenopathy  External genitalia:  Normal  BUS/Urethra/Skene's glands:  Normal  Vagina:  Normal  Cervix:  Normal IUD strings visible  Uterus:  Normal in size, shape and contour.  Midline and mobile  Adnexa/parametria:     Rt: Without masses or tenderness.   Lt: Without masses or tenderness.  Anus and perineum: Normal  Digital rectal exam: Normal sphincter tone without palpated masses or tenderness  Assessment/Plan:  24 y.o. Engaged HF GOPO for annual exam.  Mirena in place with minimal spotting STI screen History frequent UTIs- relief with Macrodantin postcoitally Bipolar II, on Lithium- looking for a psychiatrist to manage  Plan:  Pap Smear, UA, GC and Chlamydia probe, RPR, HIV, Hep B and C, CBC, Glucose, and TSH today.  Prescribed macrodantin 84m for postcoital.  Given 3 psychiatrists to try to establish care, discussed going to the hospital if she needs to.  Encouraged condoms until permanent partner. SBE's, exercise, calcium rich diet, MVI daily, decrease calories for weight loss encouraged.  Reviewed irregular spotting can be normal with Mirena IUD.     YHuel CoteWHNP, 3:02 PM 03/27/2015

## 2015-03-27 NOTE — Patient Instructions (Signed)
Health Maintenance, Female Adopting a healthy lifestyle and getting preventive care can go a long way to promote health and wellness. Talk with your health care provider about what schedule of regular examinations is right for you. This is a good chance for you to check in with your provider about disease prevention and staying healthy. In between checkups, there are plenty of things you can do on your own. Experts have done a lot of research about which lifestyle changes and preventive measures are most likely to keep you healthy. Ask your health care provider for more information. WEIGHT AND DIET  Eat a healthy diet  Be sure to include plenty of vegetables, fruits, low-fat dairy products, and lean protein.  Do not eat a lot of foods high in solid fats, added sugars, or salt.  Get regular exercise. This is one of the most important things you can do for your health.  Most adults should exercise for at least 150 minutes each week. The exercise should increase your heart rate and make you sweat (moderate-intensity exercise).  Most adults should also do strengthening exercises at least twice a week. This is in addition to the moderate-intensity exercise.  Maintain a healthy weight  Body mass index (BMI) is a measurement that can be used to identify possible weight problems. It estimates body fat based on height and weight. Your health care provider can help determine your BMI and help you achieve or maintain a healthy weight.  For females 20 years of age and older:   A BMI below 18.5 is considered underweight.  A BMI of 18.5 to 24.9 is normal.  A BMI of 25 to 29.9 is considered overweight.  A BMI of 30 and above is considered obese.  Watch levels of cholesterol and blood lipids  You should start having your blood tested for lipids and cholesterol at 22 years of age, then have this test every 5 years.  You may need to have your cholesterol levels checked more often if:  Your lipid  or cholesterol levels are high.  You are older than 22 years of age.  You are at high risk for heart disease.  CANCER SCREENING   Lung Cancer  Lung cancer screening is recommended for adults 55-80 years old who are at high risk for lung cancer because of a history of smoking.  A yearly low-dose CT scan of the lungs is recommended for people who:  Currently smoke.  Have quit within the past 15 years.  Have at least a 30-pack-year history of smoking. A pack year is smoking an average of one pack of cigarettes a day for 1 year.  Yearly screening should continue until it has been 15 years since you quit.  Yearly screening should stop if you develop a health problem that would prevent you from having lung cancer treatment.  Breast Cancer  Practice breast self-awareness. This means understanding how your breasts normally appear and feel.  It also means doing regular breast self-exams. Let your health care provider know about any changes, no matter how small.  If you are in your 20s or 30s, you should have a clinical breast exam (CBE) by a health care provider every 1-3 years as part of a regular health exam.  If you are 40 or older, have a CBE every year. Also consider having a breast X-ray (mammogram) every year.  If you have a family history of breast cancer, talk to your health care provider about genetic screening.  If you   are at high risk for breast cancer, talk to your health care provider about having an MRI and a mammogram every year.  Breast cancer gene (BRCA) assessment is recommended for women who have family members with BRCA-related cancers. BRCA-related cancers include:  Breast.  Ovarian.  Tubal.  Peritoneal cancers.  Results of the assessment will determine the need for genetic counseling and BRCA1 and BRCA2 testing. Cervical Cancer Your health care provider may recommend that you be screened regularly for cancer of the pelvic organs (ovaries, uterus, and  vagina). This screening involves a pelvic examination, including checking for microscopic changes to the surface of your cervix (Pap test). You may be encouraged to have this screening done every 3 years, beginning at age 21.  For women ages 30-65, health care providers may recommend pelvic exams and Pap testing every 3 years, or they may recommend the Pap and pelvic exam, combined with testing for human papilloma virus (HPV), every 5 years. Some types of HPV increase your risk of cervical cancer. Testing for HPV may also be done on women of any age with unclear Pap test results.  Other health care providers may not recommend any screening for nonpregnant women who are considered low risk for pelvic cancer and who do not have symptoms. Ask your health care provider if a screening pelvic exam is right for you.  If you have had past treatment for cervical cancer or a condition that could lead to cancer, you need Pap tests and screening for cancer for at least 20 years after your treatment. If Pap tests have been discontinued, your risk factors (such as having a new sexual partner) need to be reassessed to determine if screening should resume. Some women have medical problems that increase the chance of getting cervical cancer. In these cases, your health care provider may recommend more frequent screening and Pap tests. Colorectal Cancer  This type of cancer can be detected and often prevented.  Routine colorectal cancer screening usually begins at 22 years of age and continues through 22 years of age.  Your health care provider may recommend screening at an earlier age if you have risk factors for colon cancer.  Your health care provider may also recommend using home test kits to check for hidden blood in the stool.  A small camera at the end of a tube can be used to examine your colon directly (sigmoidoscopy or colonoscopy). This is done to check for the earliest forms of colorectal  cancer.  Routine screening usually begins at age 50.  Direct examination of the colon should be repeated every 5-10 years through 22 years of age. However, you may need to be screened more often if early forms of precancerous polyps or small growths are found. Skin Cancer  Check your skin from head to toe regularly.  Tell your health care provider about any new moles or changes in moles, especially if there is a change in a mole's shape or color.  Also tell your health care provider if you have a mole that is larger than the size of a pencil eraser.  Always use sunscreen. Apply sunscreen liberally and repeatedly throughout the day.  Protect yourself by wearing long sleeves, pants, a wide-brimmed hat, and sunglasses whenever you are outside. HEART DISEASE, DIABETES, AND HIGH BLOOD PRESSURE   High blood pressure causes heart disease and increases the risk of stroke. High blood pressure is more likely to develop in:  People who have blood pressure in the high end   of the normal range (130-139/85-89 mm Hg).  People who are overweight or obese.  People who are African American.  If you are 38-23 years of age, have your blood pressure checked every 3-5 years. If you are 61 years of age or older, have your blood pressure checked every year. You should have your blood pressure measured twice--once when you are at a hospital or clinic, and once when you are not at a hospital or clinic. Record the average of the two measurements. To check your blood pressure when you are not at a hospital or clinic, you can use:  An automated blood pressure machine at a pharmacy.  A home blood pressure monitor.  If you are between 45 years and 39 years old, ask your health care provider if you should take aspirin to prevent strokes.  Have regular diabetes screenings. This involves taking a blood sample to check your fasting blood sugar level.  If you are at a normal weight and have a low risk for diabetes,  have this test once every three years after 22 years of age.  If you are overweight and have a high risk for diabetes, consider being tested at a younger age or more often. PREVENTING INFECTION  Hepatitis B  If you have a higher risk for hepatitis B, you should be screened for this virus. You are considered at high risk for hepatitis B if:  You were born in a country where hepatitis B is common. Ask your health care provider which countries are considered high risk.  Your parents were born in a high-risk country, and you have not been immunized against hepatitis B (hepatitis B vaccine).  You have HIV or AIDS.  You use needles to inject street drugs.  You live with someone who has hepatitis B.  You have had sex with someone who has hepatitis B.  You get hemodialysis treatment.  You take certain medicines for conditions, including cancer, organ transplantation, and autoimmune conditions. Hepatitis C  Blood testing is recommended for:  Everyone born from [birth year] through 1963.  Anyone with known risk factors for hepatitis C. Sexually transmitted infections (STIs)  You should be screened for sexually transmitted infections (STIs) including gonorrhea and chlamydia if:  You are sexually active and are younger than 22 years of age.  You are older than 22 years of age and your health care provider tells you that you are at risk for this type of infection.  Your sexual activity has changed since you were last screened and you are at an increased risk for chlamydia or gonorrhea. Ask your health care provider if you are at risk.  If you do not have HIV, but are at risk, it may be recommended that you take a prescription medicine daily to prevent HIV infection. This is called pre-exposure prophylaxis (PrEP). You are considered at risk if:  You are sexually active and do not regularly use condoms or know the HIV status of your partner(s).  You take drugs by injection.  You are sexually  active with a partner who has HIV. Talk with your health care provider about whether you are at high risk of being infected with HIV. If you choose to begin PrEP, you should first be tested for HIV. You should then be tested every 3 months for as long as you are taking PrEP.  PREGNANCY   If you are premenopausal and you may become pregnant, ask your health care provider about preconception counseling.  If you may  become pregnant, take 400 to 800 micrograms (mcg) of folic acid every day.  If you want to prevent pregnancy, talk to your health care provider about birth control (contraception). OSTEOPOROSIS AND MENOPAUSE   Osteoporosis is a disease in which the bones lose minerals and strength with aging. This can result in serious bone fractures. Your risk for osteoporosis can be identified using a bone density scan.  If you are 64 years of age or older, or if you are at risk for osteoporosis and fractures, ask your health care provider if you should be screened.  Ask your health care provider whether you should take a calcium or vitamin D supplement to lower your risk for osteoporosis.  Menopause may have certain physical symptoms and risks.  Hormone replacement therapy may reduce some of these symptoms and risks. Talk to your health care provider about whether hormone replacement therapy is right for you.  HOME CARE INSTRUCTIONS   Schedule regular health, dental, and eye exams.  Stay current with your immunizations.   Do not use any tobacco products including cigarettes, chewing tobacco, or electronic cigarettes.  If you are pregnant, do not drink alcohol.  If you are breastfeeding, limit how much and how often you drink alcohol.  Limit alcohol intake to no more than 1 drink per day for nonpregnant women. One drink equals 12 ounces of beer, 5 ounces of wine, or 1 ounces of hard liquor.  Do not use street drugs.  Do not share needles.  Ask your health care provider for help if  you need support or information about quitting drugs.  Tell your health care provider if you often feel depressed.  Tell your health care provider if you have ever been abused or do not feel safe at home.   This information is not intended to replace advice given to you by your health care provider. Make sure you discuss any questions you have with your health care provider.   Document Released: 12/22/2010 Document Revised: 06/29/2014 Document Reviewed: 05/10/2013 Elsevier Interactive Patient Education 2016 [place] on a Budget There are many ways to save money at the grocery store and continue to eat healthy. You can be successful if you plan your meals according to your budget, purchase according to your budget and grocery list, and prepare food yourself.  How can I buy more food on a limited budget? Plan  Plan meals and snacks according to a grocery list and budget you create.  Look for recipes where you can cook once and make enough food for two meals.  Include meals that will "stretch" more expensive foods such as stews, casseroles, and stir-fry dishes.  Make a grocery list and make sure to bring it with you to the store. If you have a smart phone, you could use your phone to create your shopping list. Purchase  When grocery shopping, buy only the items on your grocery list and go only to the areas of the store that have the items on your list. Prepare  Some meal items can be prepared in advance. Pre-cook on days when you have extra time.  Make extra food (such as by doubling recipes) and freeze the extras in meal-sized containers or in individual portions for fast meals and snacks.  Use leftovers in your meal plan for the week.  Try some meatless meals or try "no cook" meals like salads.  When you come home from the grocery store, wash and prepare your fruits and vegetables so  they are ready to use and eat. This will help reduce food waste. How can I buy  more food on a limited budget?  Try these tips the next time you go shopping:   [place] store brands or generic brands.  Use coupons only for foods and brands you normally buy. Avoid buying items you wouldn't normally buy simply because they are on sale.  Check online and in newspapers for weekly deals.  Buy healthy items from the bulk bins when available, such as herbs, spices, flours, pastas, nuts, and dried fruit.  Buy fruits and vegetables that are in season. Prices are usually lower on in-season produce.  Compare and contrast different items. You can do this by looking at the unit price on the price tag. Use it to compare different brands and sizes to find out which item is the best deal.  Choose naturally low-cost healthy items, such as carrots, potatoes, apples, bananas, and oranges. Dried or canned beans are a low-cost protein source.  Buy in bulk and freeze extra food. Items you can buy in bulk include meats, fish, poultry, frozen fruits, and frozen vegetables.  Limit the purchase of prepared or "ready-to-eat" foods, such as pre-cut fruits and vegetables and pre-made salads.  If possible, shop around to discover which grocery store offers the best prices. Some stores charge much more than other stores for the same items.  Do not shop when you are hungry. If you shop while hungry, It may be hard to stick to your list and budget.  Stick to your list and resist impulse buys. Treat your list as your official plan for the week.  Buy a variety of vegetables and fruit by purchasing fresh, frozen, and canned items.  Look beyond eye level. Foods at eye level (adult or child eye level) are more expensive. Look at the top and bottom shelves for deals.  Be efficient with your time when shopping. The more time you spend at the store, the more money you are likely to spend.  Consider other retailers such as dollar stores, larger [employer], local fruit and vegetable stands, and farmers  markets. What are some tips for less expensive food substitutions? When choosing more expensive foods like meats and dairy, try these tips to save money:  Choose cheaper cuts of meat, such as bone-in chicken thighs and drumsticks instead skinless and boneless chicken. When you are ready to prepare the chicken, you can remove the skin yourself to make it healthier.  Choose lean meats like chicken or [place]. When choosing ground beef, make sure it is lean ground beef (92% lean, 8% fat). If you do buy a fattier ground beef, drain the fat before eating.  Buy dried beans and peas, such as lentils, split peas, or kidney beans.  For seafood, choose canned tuna, salmon, or sardines.  Eggs are a low-cost source of protein.  Buy the larger tubs of yogurt instead of individual-sized containers.  Choose water instead of sodas and other sweetened beverages.  Skip buying chips, cookies, and other "junk food". These items are usually expensive, high in calories, and low in nutritional value. How can I prepare the foods I buy in the healthiest way? Practice these tips for cooking foods in the healthiest way to reduce excess fat and calorie intake:  Steam, saute, grill, or bake foods instead of frying them.  Make sure half your plate is filled with fruits or vegetables. Choose from fresh, frozen, or canned fruits and vegetables. If eating canned,  remember to rinse them before eating. This will remove any excess salt added for packaging.  Trim all fat from meat before cooking. Remove the skin from chicken or [place].  Spoon off fat from meat dishes once they have been chilled in the refrigerator and the fat has hardened on the top.  Use skim milk, low-fat milk, or evaporated skim milk when making cream sauces, soups, or puddings.  Substitute low-fat yogurt, sour cream, or cottage cheese for sour cream and mayonnaise in dips and dressings.  Try lemon juice, herbs, or spices to season food instead of  salt, butter, or margarine.   This information is not intended to replace advice given to you by your health care provider. Make sure you discuss any questions you have with your health care provider.   Document Released: 02/09/2014 Document Reviewed: 02/09/2014 Elsevier Interactive Patient Education [employer].

## 2015-03-28 LAB — HEPATITIS B SURFACE ANTIGEN: Hepatitis B Surface Ag: NEGATIVE

## 2015-03-28 LAB — RPR

## 2015-03-28 LAB — HIV ANTIBODY (ROUTINE TESTING W REFLEX): HIV 1&2 Ab, 4th Generation: NONREACTIVE

## 2015-03-28 LAB — HEPATITIS C ANTIBODY: HCV AB: NEGATIVE

## 2015-03-29 LAB — CYTOLOGY - PAP

## 2015-03-29 LAB — GC/CHLAMYDIA PROBE AMP
CT PROBE, AMP APTIMA: NEGATIVE
GC PROBE AMP APTIMA: NEGATIVE

## 2015-05-14 ENCOUNTER — Other Ambulatory Visit: Payer: Self-pay | Admitting: Internal Medicine

## 2015-05-14 DIAGNOSIS — E049 Nontoxic goiter, unspecified: Secondary | ICD-10-CM

## 2015-05-22 ENCOUNTER — Ambulatory Visit (HOSPITAL_COMMUNITY)
Admission: RE | Admit: 2015-05-22 | Discharge: 2015-05-22 | Disposition: A | Discharge status: Home / Self Care | Payer: Commercial Managed Care - HMO | Source: Ambulatory Visit | Attending: Internal Medicine | Admitting: Internal Medicine

## 2015-05-22 DIAGNOSIS — E049 Nontoxic goiter, unspecified: Secondary | ICD-10-CM | POA: Insufficient documentation

## 2015-06-06 ENCOUNTER — Ambulatory Visit (INDEPENDENT_AMBULATORY_CARE_PROVIDER_SITE_OTHER): Payer: BLUE CROSS/BLUE SHIELD | Admitting: Women's Health

## 2015-06-06 ENCOUNTER — Other Ambulatory Visit: Payer: Self-pay | Admitting: Women's Health

## 2015-06-06 ENCOUNTER — Encounter: Payer: Self-pay | Admitting: Women's Health

## 2015-06-06 VITALS — BP 118/80 | Ht 65.0 in | Wt 235.0 lb

## 2015-06-06 DIAGNOSIS — F4323 Adjustment disorder with mixed anxiety and depressed mood: Secondary | ICD-10-CM

## 2015-06-06 DIAGNOSIS — N912 Amenorrhea, unspecified: Secondary | ICD-10-CM

## 2015-06-06 NOTE — Progress Notes (Signed)
Patient ID: [name], female   DOB: 26-Feb-1991, 24 y.o.   MRN: [id number] Presents with several issues. States questions if her hormone levels are in the normal range requests blood work. Has had problems with anxiety/depression/bipolar per psychiatrist has  been on lithium for almost 6 months and has felt worse than before she was on it. Reports normal TSH at endocrinologist did have a thyroid ultrasound with a small 5 mm nodule. Mirena IUD, amenorrheic. States his eyes had problems with irregular cycles with periods of amenorrhea. Denies urinary symptoms, vaginal discharge or abdominal pain. Lives at home and works part-time for her parents business.  Exam: Affect flat. Obese. External genitalia within normal limits, speculum exam IUD strings visible, no visible discharge or erythema or odor. Bimanual no CMT or adnexal tenderness.  Anxiety/depression History of irregular cycles/amenorrhea  Plan: Keep scheduled follow-up with new psychologist in January. Reviewed importance of increasing exercise risk walk daily encouraged. Increase leisure activities. Estradiol, 17 hydroxy progesterone, FSH, prolactin, ultrasound. Reviewed possible PCO S.

## 2015-06-06 NOTE — Patient Instructions (Signed)
Polycystic Ovarian Syndrome  Polycystic ovarian syndrome (PCOS) is a common hormonal disorder among women of reproductive age. Most women with PCOS grow many small cysts on their ovaries. PCOS can cause problems with your periods and make it difficult to get pregnant. It can also cause an increased risk of miscarriage with pregnancy. If left untreated, PCOS can lead to serious health problems, such as diabetes and heart disease.  CAUSES  The cause of PCOS is not fully understood, but genetics may be a factor.  SIGNS AND SYMPTOMS   · Infrequent or no menstrual periods.    · Inability to get pregnant (infertility) because of not ovulating.    · Increased growth of hair on the face, chest, stomach, back, thumbs, thighs, or toes.    · Acne, oily skin, or dandruff.    · Pelvic pain.    · Weight gain or obesity, usually carrying extra weight around the waist.    · Type 2 diabetes.     · High cholesterol.    · High blood pressure.    · Female-pattern baldness or thinning hair.    · Patches of thickened and dark brown or black skin on the neck, arms, breasts, or thighs.    · Tiny excess flaps of skin (skin tags) in the armpits or neck area.    · Excessive snoring and having breathing stop at times while asleep (sleep apnea).    · Deepening of the voice.    · Gestational diabetes when pregnant.    DIAGNOSIS   There is no single test to diagnose PCOS.   · Your health care provider will:      Take a medical history.      Perform a pelvic exam.      Have ultrasonography done.      Check your female and female hormone levels.      Measure glucose or sugar levels in the blood.      Do other blood tests.    · If you are producing too many female hormones, your health care provider will make sure it is from PCOS. At the physical exam, your health care provider will want to evaluate the areas of increased hair growth. Try to allow natural hair growth for a few days before the visit.    · During a pelvic exam, the ovaries may be enlarged  or swollen because of the increased number of small cysts. This can be seen more easily by using vaginal ultrasonography or screening to examine the ovaries and lining of the uterus (endometrium) for cysts. The uterine lining may become thicker if you have not been having a regular period.    TREATMENT   Because there is no cure for PCOS, it needs to be managed to prevent problems. Treatments are based on your symptoms. Treatment is also based on whether you want to have a baby or whether you need contraception.   Treatment may include:   · Progesterone hormone to start a menstrual period.    · Birth control pills to make you have regular menstrual periods.    · Medicines to make you ovulate, if you want to get pregnant.    · Medicines to control your insulin.    · Medicine to control your blood pressure.    · Medicine and diet to control your high cholesterol and triglycerides in your blood.  · Medicine to reduce excessive hair growth.   · Surgery, making small holes in the ovary, to decrease the amount of female hormone production. This is done through a long, lighted tube (laparoscope) placed into the pelvis through a tiny incision in the lower abdomen.      HOME CARE INSTRUCTIONS  · Only take over-the-counter or prescription medicine as directed by your health care provider.  · Pay attention to the foods you eat and your activity levels. This can help reduce the effects of PCOS.    Keep your weight under control.    Eat foods that are low in carbohydrate and high in fiber.    Exercise regularly.  SEEK MEDICAL CARE IF:  · Your symptoms do not get better with medicine.  · You have new symptoms.     This information is not intended to replace advice given to you by your health care provider. Make sure you discuss any questions you have with your health care provider.     Document Released: 10/02/2004 Document Revised: 03/29/2013 Document Reviewed: 11/24/2012  Elsevier Interactive Patient Education ©2016 Else[employer]  Inc[employer]

## 2015-06-07 LAB — FOLLICLE STIMULATING HORMONE: FSH: 5.3 m[IU]/mL

## 2015-06-07 LAB — ESTRADIOL: Estradiol: 109.1 pg/mL

## 2015-06-07 LAB — PROLACTIN: PROLACTIN: 23.9 ng/mL

## 2015-06-10 LAB — 17-HYDROXYPROGESTERONE: 17-OH-Progesterone, LC/MS/MS: 96 ng/dL

## 2015-06-12 ENCOUNTER — Ambulatory Visit (INDEPENDENT_AMBULATORY_CARE_PROVIDER_SITE_OTHER): Payer: BLUE CROSS/BLUE SHIELD

## 2015-06-12 ENCOUNTER — Encounter: Payer: Self-pay | Admitting: Women's Health

## 2015-06-12 ENCOUNTER — Ambulatory Visit (INDEPENDENT_AMBULATORY_CARE_PROVIDER_SITE_OTHER): Payer: BLUE CROSS/BLUE SHIELD | Admitting: Women's Health

## 2015-06-12 ENCOUNTER — Other Ambulatory Visit: Payer: Self-pay | Admitting: Women's Health

## 2015-06-12 VITALS — BP 126/80 | Ht 65.0 in | Wt 235.0 lb

## 2015-06-12 DIAGNOSIS — N831 Corpus luteum cyst of ovary, unspecified side: Secondary | ICD-10-CM

## 2015-06-12 DIAGNOSIS — N912 Amenorrhea, unspecified: Secondary | ICD-10-CM

## 2015-06-12 DIAGNOSIS — Z8742 Personal history of other diseases of the female genital tract: Secondary | ICD-10-CM | POA: Diagnosis not present

## 2015-06-12 NOTE — Progress Notes (Signed)
Patient ID: [name], female   DOB: [birth year], 24 y.o.   MRN: [id number] Presents for ultrasound. History of amenorrhea/irregular cycles, monthly cycle on OCs with on from age 22-23, prior to OCs cycles were anywhere from 1 to 3/4 a year. Normal TSH, prolactin, progesterone, low FSH. Currently has a Mirena IUD with rare spotting.  Exam: Appears well. Ultrasound: T/V anteverted retroverted uterus with IUD seen in normal position. Tri layered endometrium, 4.3 mm. Right ovary collapsed CL cyst internal low level echoes 25 x 17 mm with positive CFD periphery. Left ovary normal. Negative CDS. Normal ovarian volume.  Normal GYN ultrasound Mirena IUD History of irregular cycles/amenorrhea Obesity  Plan: Reviewed normality of ultrasound. Does not appear to be PCOS. Reviewed and encouraged weight loss for health. Not desiring pregnancy in the near future.

## 2015-08-27 ENCOUNTER — Ambulatory Visit (INDEPENDENT_AMBULATORY_CARE_PROVIDER_SITE_OTHER): Payer: BLUE CROSS/BLUE SHIELD | Admitting: Women's Health

## 2015-08-27 ENCOUNTER — Encounter: Payer: Self-pay | Admitting: Women's Health

## 2015-08-27 VITALS — BP 122/80 | Wt 233.0 lb

## 2015-08-27 DIAGNOSIS — N898 Other specified noninflammatory disorders of vagina: Secondary | ICD-10-CM

## 2015-08-27 LAB — WET PREP FOR TRICH, YEAST, CLUE
CLUE CELLS WET PREP: NONE SEEN
TRICH WET PREP: NONE SEEN
Yeast Wet Prep HPF POC: NONE SEEN

## 2015-08-27 NOTE — Progress Notes (Signed)
Patient ID: Grace BuffBettina Russell, female   DOB: Jan 15, 1991, 25 y.o.   MRN: [id number] Presents with complaint of spotting after intercourse, brown discharge, questionable faithfulness of boyfriend. Mirena IUD placed 2015, ultrasound 05/2015 IUD in normal position. Long history of depression and anxiety under the care of psychiatrist does have follow-up scheduled. History of cutting, last cutting January 2017.  Exam: Affect flat, abdomen obese nontender, external genitalia within normal limits, speculum exam cervix scant amount of brown discharge, wet prep negative, GC/Chlamydia culture taken, IUD strings visible. Bimanual no CMT or adnexal tenderness.  Spotting after intercourse Mirena IUD  Plan: Declines HIV, hepatitis or RPR, encouraged condoms. Reassured  normality of exam. Call if continued problems with discharge or abdominal pain.

## 2015-08-28 LAB — GC/CHLAMYDIA PROBE AMP
CT Probe RNA: NOT DETECTED
GC Probe RNA: NOT DETECTED

## 2016-07-10 ENCOUNTER — Encounter: Payer: BLUE CROSS/BLUE SHIELD | Admitting: Women's Health

## 2016-09-16 ENCOUNTER — Encounter: Payer: BLUE CROSS/BLUE SHIELD | Admitting: Women's Health

## 2016-09-16 DIAGNOSIS — Z0289 Encounter for other administrative examinations: Secondary | ICD-10-CM

## 2016-11-04 ENCOUNTER — Encounter: Payer: Self-pay | Admitting: Gynecology

## 2016-12-20 IMAGING — US US SOFT TISSUE HEAD/NECK
1 series · 14 of 25 positions shown · non-contrast
Comparison: None.

CLINICAL DATA: Goiter

EXAM:
THYROID ULTRASOUND
TECHNIQUE: Ultrasound examination of the thyroid gland and adjacent soft
tissues was performed.

[Series 1: us soft tissue head/neck · 0.06mm/px · 14 of 43 slices shown]
[im 1/43]
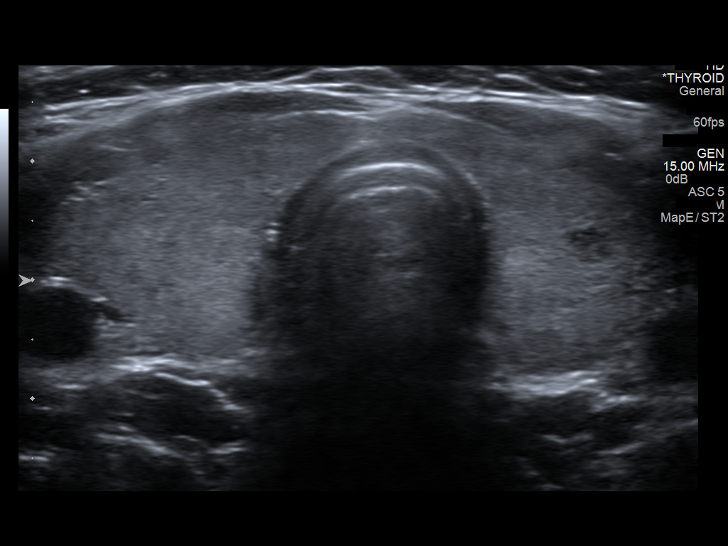
[im 4/43]
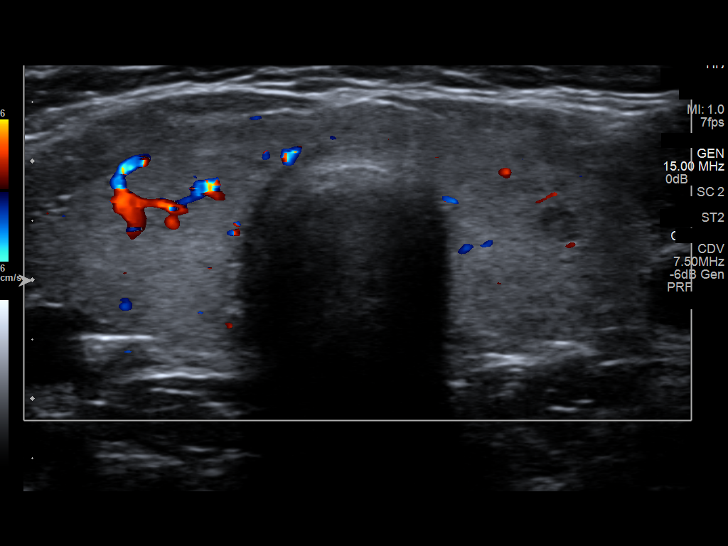
[im 8/43]
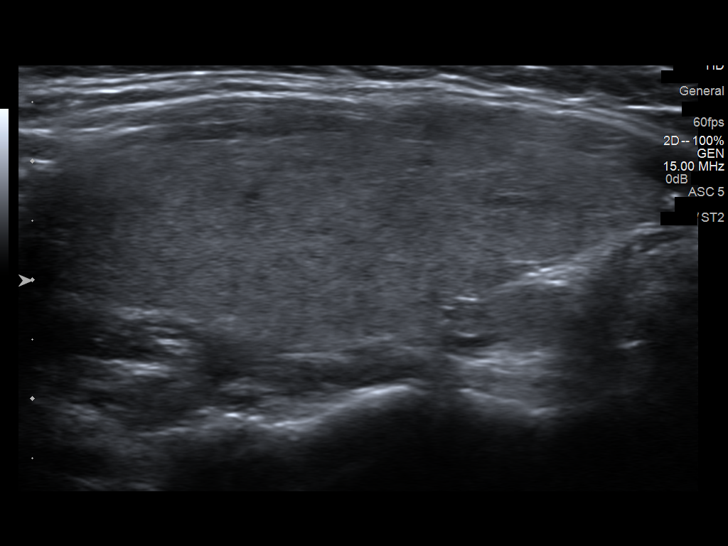
[im 11/43]
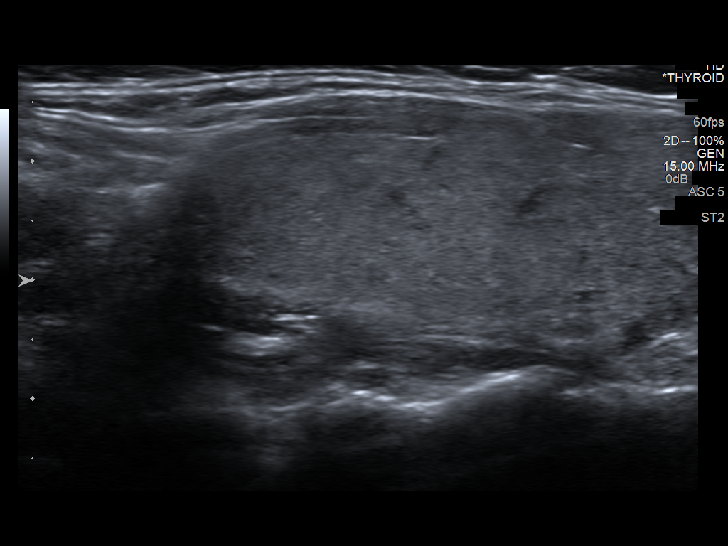
[im 15/43]
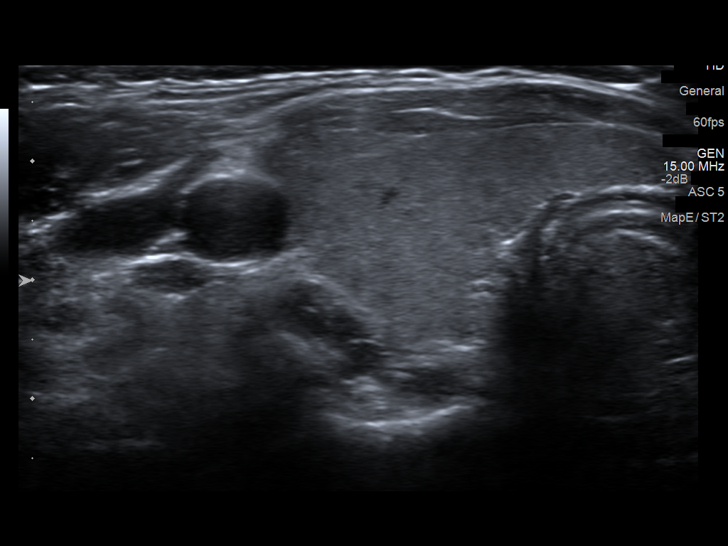
[im 16/43]
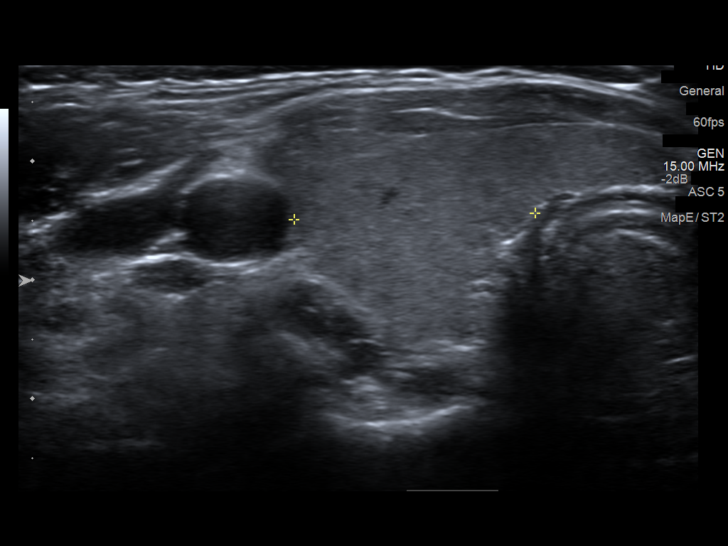
[im 20/43]
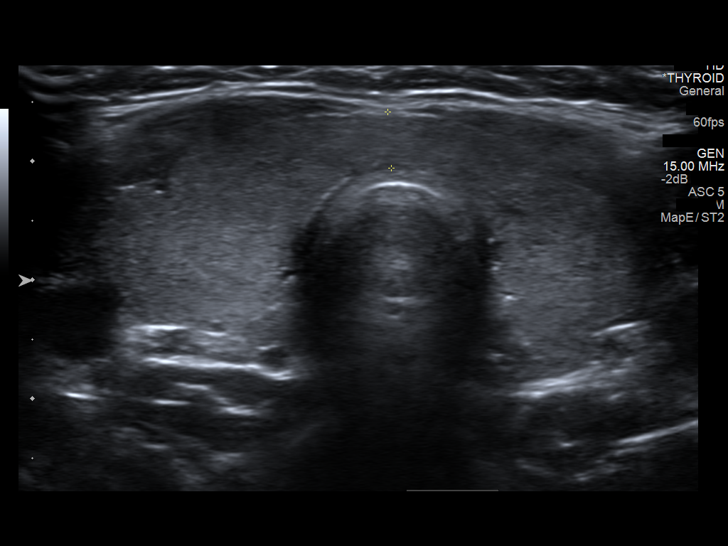
[im 23/43]
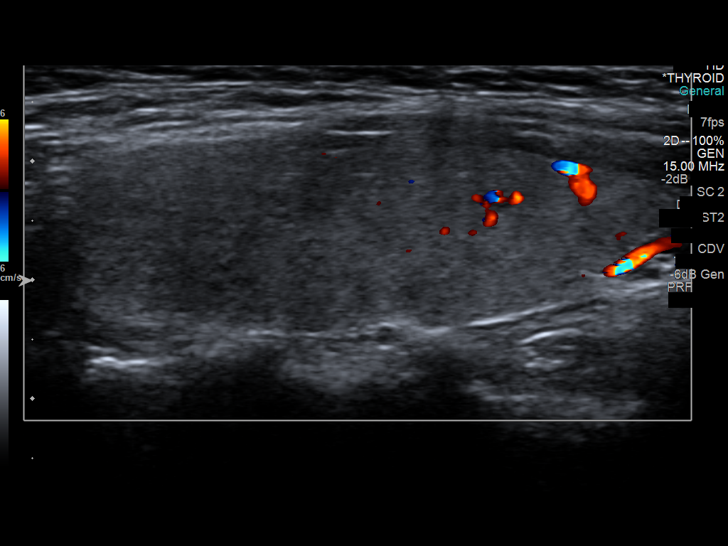
[im 27/43]
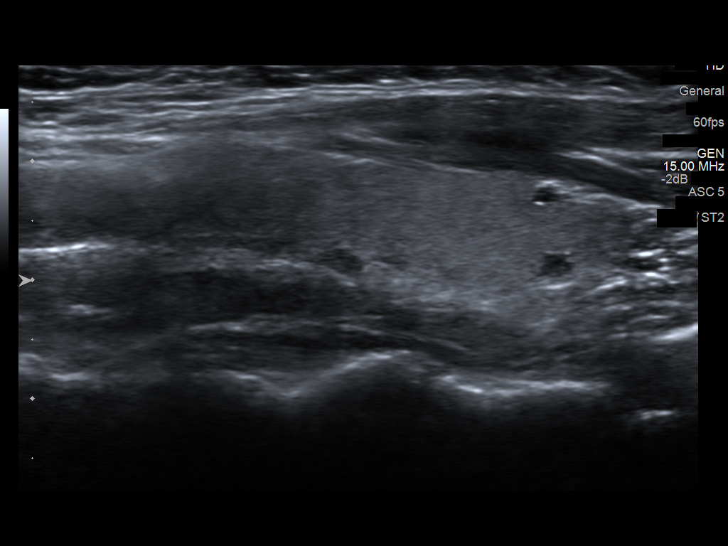
[im 29/43]
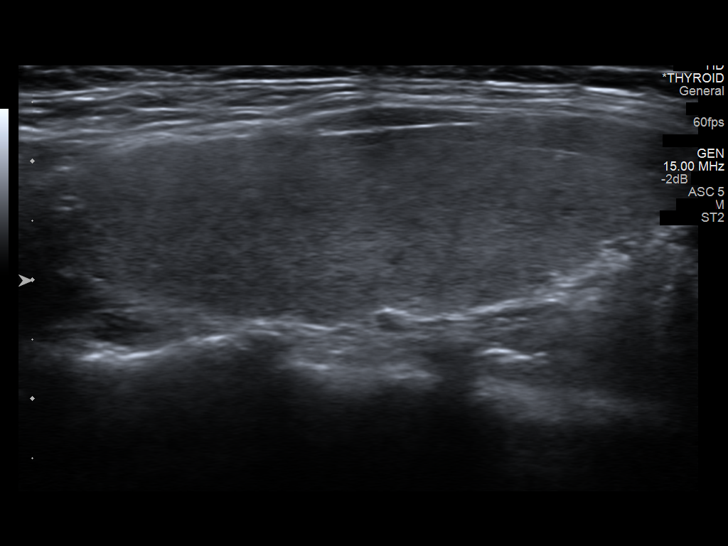
[im 32/43]
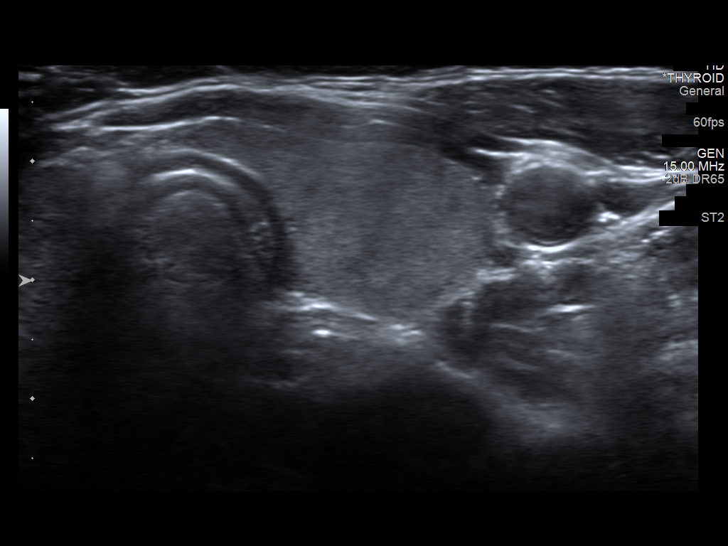
[im 36/43]
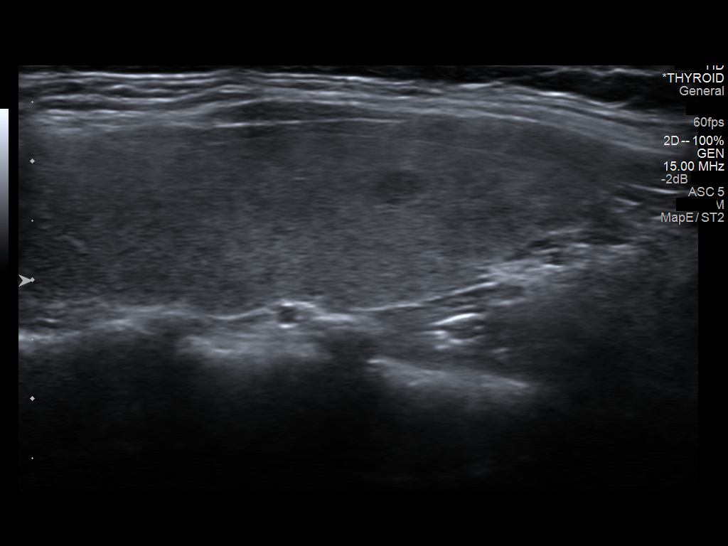
[im 39/43]
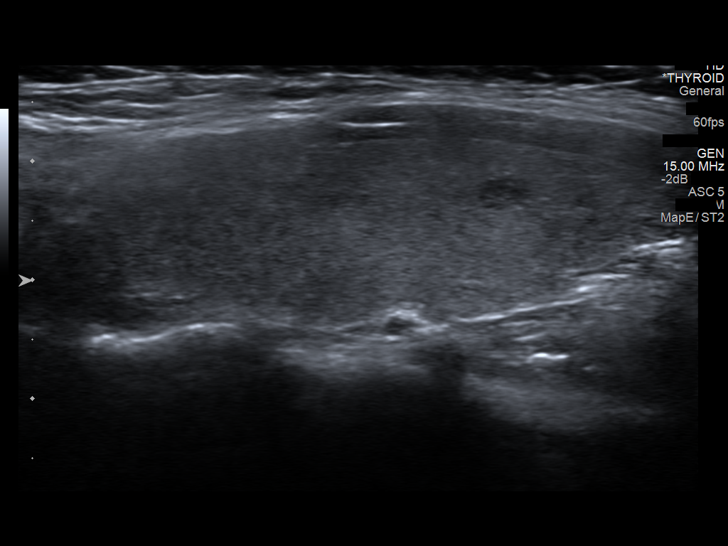
[im 43/43]
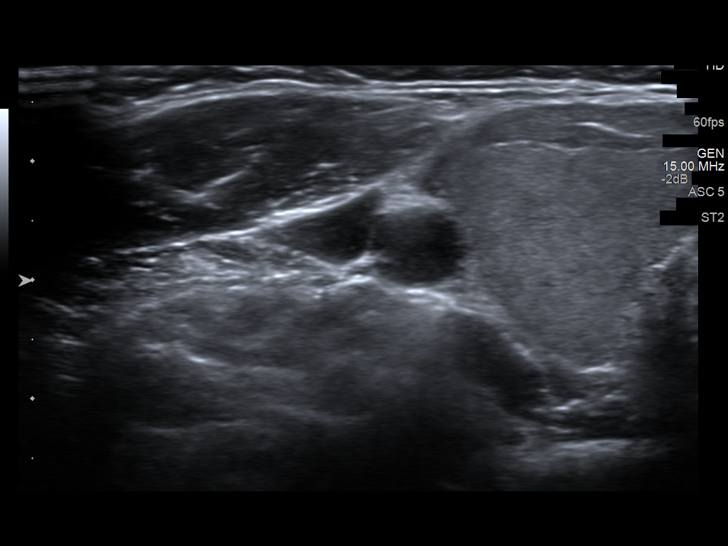

[14 of 25 positions shown; findings below may reference images not displayed]

FINDINGS: Right thyroid lobe

Measurements: 5.5 x 1.8 x 2.0 cm. Heterogeneous tissue without
nodule.

Left thyroid lobe

Measurements: 5.7 x 1.6 x 1.8 cm. Heterogeneous tissue. 5 mm lower
pole hypoechoic nodule.

Isthmus

Thickness: 5 mm.  No nodules visualized.

Lymphadenopathy

None visualized.
IMPRESSION: Small left lower pole nodule measures 5 mm. Findings do not meet
current SRU consensus criteria for biopsy. Follow-up by clinical
exam is recommended. If patient has known risk factors for thyroid
carcinoma, consider follow-up ultrasound in 12 months. If patient is
clinically hyperthyroid, consider nuclear medicine thyroid uptake
and scan.Reference: Management of Thyroid Nodules Detected at US:
Society of Radiologists in Ultrasound Consensus Conference

## 2018-10-03 ENCOUNTER — Ambulatory Visit: Payer: BLUE CROSS/BLUE SHIELD | Admitting: Women's Health

## 2018-10-03 ENCOUNTER — Other Ambulatory Visit: Payer: Self-pay

## 2018-10-04 ENCOUNTER — Ambulatory Visit (INDEPENDENT_AMBULATORY_CARE_PROVIDER_SITE_OTHER): Payer: PRIVATE HEALTH INSURANCE | Admitting: Women's Health

## 2018-10-04 ENCOUNTER — Encounter: Payer: Self-pay | Admitting: Women's Health

## 2018-10-04 ENCOUNTER — Other Ambulatory Visit: Payer: Self-pay

## 2018-10-04 VITALS — BP 124/78 | Ht 63.25 in | Wt 291.0 lb

## 2018-10-04 DIAGNOSIS — Z01419 Encounter for gynecological examination (general) (routine) without abnormal findings: Secondary | ICD-10-CM

## 2018-10-04 DIAGNOSIS — Z30432 Encounter for removal of intrauterine contraceptive device: Secondary | ICD-10-CM | POA: Diagnosis not present

## 2018-10-04 DIAGNOSIS — Z113 Encounter for screening for infections with a predominantly sexual mode of transmission: Secondary | ICD-10-CM

## 2018-10-04 MED ORDER — NORELGESTROMIN-ETH ESTRADIOL 150-35 MCG/24HR TD PTWK: 1.0000 | MEDICATED_PATCH | TRANSDERMAL | 4 refills | Status: DC

## 2018-10-04 NOTE — Patient Instructions (Signed)
Health Maintenance, Female Adopting a healthy lifestyle and getting preventive care can go a long way to promote health and wellness. Talk with your health care provider about what schedule of regular examinations is right for you. This is a good chance for you to check in with your provider about disease prevention and staying healthy. In between checkups, there are plenty of things you can do on your own. Experts have done a lot of research about which lifestyle changes and preventive measures are most likely to keep you healthy. Ask your health care provider for more information. Weight and diet Eat a healthy diet  Be sure to include plenty of vegetables, fruits, low-fat dairy products, and lean protein.  Do not eat a lot of foods high in solid fats, added sugars, or salt.  Get regular exercise. This is one of the most important things you can do for your health. ? Most adults should exercise for at least 150 minutes each week. The exercise should increase your heart rate and make you sweat (moderate-intensity exercise). ? Most adults should also do strengthening exercises at least twice a week. This is in addition to the moderate-intensity exercise. Maintain a healthy weight  Body mass index (BMI) is a measurement that can be used to identify possible weight problems. It estimates body fat based on height and weight. Your health care provider can help determine your BMI and help you achieve or maintain a healthy weight.  For females 26 years of age and older: ? A BMI below 18.5 is considered underweight. ? A BMI of 18.5 to 24.9 is normal. ? A BMI of 25 to 29.9 is considered overweight. ? A BMI of 30 and above is considered obese. Watch levels of cholesterol and blood lipids  You should start having your blood tested for lipids and cholesterol at 26 years of age, then have this test every 5 years.  You may need to have your cholesterol levels checked more often if: ? Your lipid or  cholesterol levels are high. ? You are older than 26 years of age. ? You are at high risk for heart disease. Cancer screening Lung Cancer  Lung cancer screening is recommended for adults 26-20 years old who are at high risk for lung cancer because of a history of smoking.  A yearly low-dose CT scan of the lungs is recommended for people who: ? Currently smoke. ? Have quit within the past 15 years. ? Have at least a 30-pack-year history of smoking. A pack year is smoking an average of one pack of cigarettes a day for 1 year.  Yearly screening should continue until it has been 15 years since you quit.  Yearly screening should stop if you develop a health problem that would prevent you from having lung cancer treatment. Breast Cancer  Practice breast self-awareness. This means understanding how your breasts normally appear and feel.  It also means doing regular breast self-exams. Let your health care provider know about any changes, no matter how small.  If you are in your 20s or 30s, you should have a clinical breast exam (CBE) by a health care provider every 26-3 years as part of a regular health exam.  If you are 26 or older, have a CBE every year. Also consider having a breast X-ray (mammogram) every year.  If you have a family history of breast cancer, talk to your health care provider about genetic screening.  If you are at high risk for breast cancer, talk  to your health care provider about having an MRI and a mammogram every year.  Breast cancer gene (BRCA) assessment is recommended for women who have family members with BRCA-related cancers. BRCA-related cancers include: ? Breast. ? Ovarian. ? Tubal. ? Peritoneal cancers.  Results of the assessment will determine the need for genetic counseling and BRCA1 and BRCA2 testing. Cervical Cancer Your health care provider may recommend that you be screened regularly for cancer of the pelvic organs (ovaries, uterus, and vagina).  This screening involves a pelvic examination, including checking for microscopic changes to the surface of your cervix (Pap test). You may be encouraged to have this screening done every 3 years, beginning at age 26.  For women ages 26-65, health care providers may recommend pelvic exams and Pap testing every 3 years, or they may recommend the Pap and pelvic exam, combined with testing for human papilloma virus (HPV), every 5 years. Some types of HPV increase your risk of cervical cancer. Testing for HPV may also be done on women of any age with unclear Pap test results.  Other health care providers may not recommend any screening for nonpregnant women who are considered low risk for pelvic cancer and who do not have symptoms. Ask your health care provider if a screening pelvic exam is right for you.  If you have had past treatment for cervical cancer or a condition that could lead to cancer, you need Pap tests and screening for cancer for at least 20 years after your treatment. If Pap tests have been discontinued, your risk factors (such as having a new sexual partner) need to be reassessed to determine if screening should resume. Some women have medical problems that increase the chance of getting cervical cancer. In these cases, your health care provider may recommend more frequent screening and Pap tests. Colorectal Cancer  This type of cancer can be detected and often prevented.  Routine colorectal cancer screening usually begins at 26 years of age and continues through 26 years of age.  Your health care provider may recommend screening at an earlier age if you have risk factors for colon cancer.  Your health care provider may also recommend using home test kits to check for hidden blood in the stool.  A small camera at the end of a tube can be used to examine your colon directly (sigmoidoscopy or colonoscopy). This is done to check for the earliest forms of colorectal cancer.  Routine  screening usually begins at age 50.  Direct examination of the colon should be repeated every 5-10 years through 26 years of age. However, you may need to be screened more often if early forms of precancerous polyps or small growths are found. Skin Cancer  Check your skin from head to toe regularly.  Tell your health care provider about any new moles or changes in moles, especially if there is a change in a mole's shape or color.  Also tell your health care provider if you have a mole that is larger than the size of a pencil eraser.  Always use sunscreen. Apply sunscreen liberally and repeatedly throughout the day.  Protect yourself by wearing long sleeves, pants, a wide-brimmed hat, and sunglasses whenever you are outside. Heart disease, diabetes, and high blood pressure  High blood pressure causes heart disease and increases the risk of stroke. High blood pressure is more likely to develop in: ? People who have blood pressure in the high end of the normal range (130-139/85-89 mm Hg). ? People   who are overweight or obese. ? People who are African American.  If you are 84-22 years of age, have your blood pressure checked every 3-5 years. If you are 67 years of age or older, have your blood pressure checked every year. You should have your blood pressure measured twice-once when you are at a hospital or clinic, and once when you are not at a hospital or clinic. Record the average of the two measurements. To check your blood pressure when you are not at a hospital or clinic, you can use: ? An automated blood pressure machine at a pharmacy. ? A home blood pressure monitor.  If you are between 52 years and 3 years old, ask your health care provider if you should take aspirin to prevent strokes.  Have regular diabetes screenings. This involves taking a blood sample to check your fasting blood sugar level. ? If you are at a normal weight and have a low risk for diabetes, have this test once  every three years after 26 years of age. ? If you are overweight and have a high risk for diabetes, consider being tested at a younger age or more often. Preventing infection Hepatitis B  If you have a higher risk for hepatitis B, you should be screened for this virus. You are considered at high risk for hepatitis B if: ? You were born in a country where hepatitis B is common. Ask your health care provider which countries are considered high risk. ? Your parents were born in a high-risk country, and you have not been immunized against hepatitis B (hepatitis B vaccine). ? You have HIV or AIDS. ? You use needles to inject street drugs. ? You live with someone who has hepatitis B. ? You have had sex with someone who has hepatitis B. ? You get hemodialysis treatment. ? You take certain medicines for conditions, including cancer, organ transplantation, and autoimmune conditions. Hepatitis C  Blood testing is recommended for: ? Everyone born from [birth year] through 1963. ? Anyone with known risk factors for hepatitis C. Sexually transmitted infections (STIs)  You should be screened for sexually transmitted infections (STIs) including gonorrhea and chlamydia if: ? You are sexually active and are younger than 26 years of age. ? You are older than 26 years of age and your health care provider tells you that you are at risk for this type of infection. ? Your sexual activity has changed since you were last screened and you are at an increased risk for chlamydia or gonorrhea. Ask your health care provider if you are at risk.  If you do not have HIV, but are at risk, it may be recommended that you take a prescription medicine daily to prevent HIV infection. This is called pre-exposure prophylaxis (PrEP). You are considered at risk if: ? You are sexually active and do not regularly use condoms or know the HIV status of your partner(s). ? You take drugs by injection. ? You are sexually active with a partner  who has HIV. Talk with your health care provider about whether you are at high risk of being infected with HIV. If you choose to begin PrEP, you should first be tested for HIV. You should then be tested every 3 months for as long as you are taking PrEP. Pregnancy  If you are premenopausal and you may become pregnant, ask your health care provider about preconception counseling.  If you may become pregnant, take 400 to 800 micrograms (mcg) of folic acid every  day.  If you want to prevent pregnancy, talk to your health care provider about birth control (contraception). Osteoporosis and menopause  Osteoporosis is a disease in which the bones lose minerals and strength with aging. This can result in serious bone fractures. Your risk for osteoporosis can be identified using a bone density scan.  If you are 36 years of age or older, or if you are at risk for osteoporosis and fractures, ask your health care provider if you should be screened.  Ask your health care provider whether you should take a calcium or vitamin D supplement to lower your risk for osteoporosis.  Menopause may have certain physical symptoms and risks.  Hormone replacement therapy may reduce some of these symptoms and risks. Talk to your health care provider about whether hormone replacement therapy is right for you. Follow these instructions at home:  Schedule regular health, dental, and eye exams.  Stay current with your immunizations.  Do not use any tobacco products including cigarettes, chewing tobacco, or electronic cigarettes.  If you are pregnant, do not drink alcohol.  If you are breastfeeding, limit how much and how often you drink alcohol.  Limit alcohol intake to no more than 1 drink per day for nonpregnant women. One drink equals 12 ounces of beer, 5 ounces of wine, or 1 ounces of hard liquor.  Do not use street drugs.  Do not share needles.  Ask your health care provider for help if you need support  or information about quitting drugs.  Tell your health care provider if you often feel depressed.  Tell your health care provider if you have ever been abused or do not feel safe at home. This information is not intended to replace advice given to you by your health care provider. Make sure you discuss any questions you have with your health care provider. Document Released: 12/22/2010 Document Revised: 11/14/2015 Document Reviewed: 03/12/2015 Elsevier Interactive Patient Education  2019 [employer].

## 2018-10-04 NOTE — Progress Notes (Signed)
Adler Obeso 24-Feb-1991 [id number]    History:    Presents for annual exam.  August 2015 Mirena IUD placed by [facility] amenorrheic but would like to have removed.  Normal Pap history.  Gardasil series completed.  New partner.  Last here 30-Oct-2014.  Bipolar therapist and primary care manage therapy and meds.  Has gained 60 pounds in the past 4 years.  Past medical history, past surgical history, family history and social history were all reviewed and documented in the EPIC chart.  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  Father died 10-29-2016 from heart and kidney failure.  ROS:  A ROS was performed and pertinent positives and negatives are included.  Exam:  Vitals:   10/04/18 1213  BP: 124/78  Weight: 291 lb (132 kg)  Height: 5' 3.25" (1.607 m)   Body mass index is 51.14 kg/m.   General appearance:  Normal Thyroid:  Symmetrical, normal in size, without palpable masses or nodularity. Respiratory  Auscultation:  Clear without wheezing or rhonchi Cardiovascular  Auscultation:  Regular rate, without rubs, murmurs or gallops  Edema/varicosities:  Not grossly evident Abdominal  Soft,nontender, without masses, guarding or rebound.  Liver/spleen:  No organomegaly noted  Hernia:  None appreciated  Skin  Inspection:  Grossly normal   Breasts: Examined lying and sitting.     Right: Without masses, retractions, discharge or axillary adenopathy.     Left: Without masses, retractions, discharge or axillary adenopathy. Gentitourinary   Inguinal/mons:  Normal without inguinal adenopathy  External genitalia:  Normal  BUS/Urethra/Skene's glands:  Normal  Vagina:  Normal  Cervix:  Normal IUD strings visible, grasped with ring forcep, removed, shown to patient and discarded  Uterus: normal in size, shape and contour.  Midline and mobile  Adnexa/parametria:     Rt: Without masses or tenderness.   Lt: Without masses or tenderness.  Anus and perineum: Normal  Digital rectal  exam: Normal sphincter tone without palpated masses or tenderness  Assessment/Plan:  28 y.o. S WF G0 for annual exam.   .  Mirena IUD removed Contraception management Bipolar-primary care and therapist manage Morbid obesity STD screen  Plan: CBC, glucose, GC/chlamydia, HIV, RPR.  Pap.  Contraception options reviewed, would like to try Ortho Evra patch 1 patch weekly for 3 weeks reviewed decreased efficacy with weight and slight risk for blood clots and strokes.  Encouraged condoms until permanent partner.  SBEs, increase regular cardio type exercise and decrease calorie/carbs and MVI daily encouraged.  Reports drinking alcohol on a regular basis encouraged decreased or stopping.  Healthy lifestyle and leisure activities encouraged.    Harrington Challenger [facility], 2:09 PM 10/04/2018

## 2018-10-05 LAB — PAP IG W/ RFLX HPV ASCU

## 2018-10-05 LAB — C. TRACHOMATIS/N. GONORRHOEAE RNA
C. trachomatis RNA, TMA: NOT DETECTED
N. gonorrhoeae RNA, TMA: NOT DETECTED

## 2018-10-11 ENCOUNTER — Other Ambulatory Visit: Payer: Self-pay | Admitting: Gynecology

## 2018-10-11 ENCOUNTER — Other Ambulatory Visit: Payer: Self-pay

## 2018-10-11 ENCOUNTER — Other Ambulatory Visit: Payer: PRIVATE HEALTH INSURANCE

## 2018-10-11 DIAGNOSIS — R300 Dysuria: Secondary | ICD-10-CM

## 2018-10-11 DIAGNOSIS — Z113 Encounter for screening for infections with a predominantly sexual mode of transmission: Secondary | ICD-10-CM

## 2018-10-11 DIAGNOSIS — Z01419 Encounter for gynecological examination (general) (routine) without abnormal findings: Secondary | ICD-10-CM

## 2018-10-13 LAB — CBC WITH DIFFERENTIAL/PLATELET
Absolute Monocytes: 608 cells/uL (ref 200–950)
Basophils Absolute: 8 cells/uL (ref 0–200)
Basophils Relative: 0.1 %
Eosinophils Absolute: 109 cells/uL (ref 15–500)
Eosinophils Relative: 1.4 %
HCT: 42 % (ref 35.0–45.0)
Hemoglobin: 14.4 g/dL (ref 11.7–15.5)
Lymphs Abs: 2597 cells/uL (ref 850–3900)
MCH: 30.4 pg (ref 27.0–33.0)
MCHC: 34.3 g/dL (ref 32.0–36.0)
MCV: 88.8 fL (ref 80.0–100.0)
MPV: 9.8 fL (ref 7.5–12.5)
Monocytes Relative: 7.8 %
Neutro Abs: 4477 cells/uL (ref 1500–7800)
Neutrophils Relative %: 57.4 %
Platelets: 329 10*3/uL (ref 140–400)
RBC: 4.73 10*6/uL (ref 3.80–5.10)
RDW: 12.8 % (ref 11.0–15.0)
Total Lymphocyte: 33.3 %
WBC: 7.8 10*3/uL (ref 3.8–10.8)

## 2018-10-13 LAB — CULTURE INDICATED

## 2018-10-13 LAB — URINE CULTURE
MICRO NUMBER:: 413610
SPECIMEN QUALITY:: ADEQUATE

## 2018-10-13 LAB — TEST AUTHORIZATION

## 2018-10-13 LAB — URINALYSIS, COMPLETE W/RFL CULTURE
Bilirubin Urine: NEGATIVE
Glucose, UA: NEGATIVE
Ketones, ur: NEGATIVE
Nitrites, Initial: POSITIVE — AB
Protein, ur: NEGATIVE
Specific Gravity, Urine: 1.021 (ref 1.001–1.03)
WBC, UA: >=60 /HPF — AB (ref 0–5)
pH: <=5 (ref 5.0–8.0)

## 2018-10-13 LAB — GLUCOSE, RANDOM: Glucose, Bld: 124 mg/dL — ABNORMAL HIGH (ref 65–99)

## 2018-10-13 LAB — HIV ANTIBODY (ROUTINE TESTING W REFLEX): HIV 1&2 Ab, 4th Generation: NONREACTIVE

## 2018-10-13 LAB — RPR: RPR Ser Ql: NONREACTIVE

## 2018-10-13 LAB — HEMOGLOBIN A1C W/OUT EAG: Hgb A1c MFr Bld: 5.6 % of total Hgb (ref <5.7)

## 2018-10-17 ENCOUNTER — Other Ambulatory Visit: Payer: Self-pay | Admitting: Women's Health

## 2018-10-17 MED ORDER — SULFAMETHOXAZOLE-TRIMETHOPRIM 800-160 MG PO TABS: 1.0000 | ORAL_TABLET | Freq: Two times a day (BID) | ORAL | 0 refills | Status: DC

## 2019-03-08 ENCOUNTER — Telehealth: Payer: Self-pay | Admitting: *Deleted

## 2019-03-08 NOTE — Telephone Encounter (Signed)
Patient called reports she stopped ortho evra patch and would like to have Mirena IUD inserted. Had IUD removed at [facility] on 10/04/18. Would you like patient to schedule visit to discuss? Or okay to transfer to Abigail Butts to check benefits? Please advise

## 2019-03-08 NOTE — Telephone Encounter (Signed)
Yes ok to have Grace Russell check, let me know if she has questions.

## 2019-03-08 NOTE — Telephone Encounter (Signed)
I left detailed message on cell to call and schedule visit if questions and would like to discuss with [place]. If not questions I will route message to Abigail Butts to check benefits and she will call her with cost.

## 2019-03-22 ENCOUNTER — Other Ambulatory Visit: Payer: Self-pay | Admitting: Gastroenterology

## 2019-03-22 DIAGNOSIS — R110 Nausea: Secondary | ICD-10-CM

## 2019-03-28 ENCOUNTER — Ambulatory Visit
Admission: RE | Admit: 2019-03-28 | Discharge: 2019-03-28 | Disposition: A | Discharge status: Home / Self Care | Payer: Self-pay | Source: Ambulatory Visit | Attending: Gastroenterology | Admitting: Gastroenterology

## 2019-03-28 ENCOUNTER — Encounter: Payer: Self-pay | Admitting: Radiology

## 2019-03-28 DIAGNOSIS — R110 Nausea: Secondary | ICD-10-CM

## 2019-04-04 ENCOUNTER — Ambulatory Visit: Payer: PRIVATE HEALTH INSURANCE | Admitting: Gynecology

## 2019-04-04 ENCOUNTER — Other Ambulatory Visit: Payer: Self-pay

## 2019-04-05 ENCOUNTER — Other Ambulatory Visit: Payer: Self-pay

## 2019-04-06 ENCOUNTER — Encounter: Payer: Self-pay | Admitting: Gynecology

## 2019-04-06 ENCOUNTER — Ambulatory Visit (INDEPENDENT_AMBULATORY_CARE_PROVIDER_SITE_OTHER): Payer: PRIVATE HEALTH INSURANCE | Admitting: Gynecology

## 2019-04-06 VITALS — BP 134/80

## 2019-04-06 DIAGNOSIS — Z30430 Encounter for insertion of intrauterine contraceptive device: Secondary | ICD-10-CM | POA: Diagnosis not present

## 2019-04-06 HISTORY — PX: INTRAUTERINE DEVICE INSERTION: SHX323

## 2019-04-06 NOTE — Progress Notes (Signed)
    Grace Russell 1990-12-08 [id number]        27 y.o.  G0P0000  presents for Mirena IUD placement. She has read through the booklet, has no contraindications and signed the consent form. She currently is on a normal menses.  Recently had a Mirena IUD removed and Grace Russell as it has been 5 years.  Wants to go ahead and have another IUD.  I reviewed the insertional process with her as well as the risks to include infection, either immediate or long-term, uterine perforation or migration requiring surgery to remove, other complications such as pain, hormonal side effects, infertility and possibility of failure with subsequent pregnancy.   Exam with Caryn Bee assistant Vitals:   04/06/19 1213  BP: 134/80    Pelvic: External BUS vagina normal. Cervix normal with heavy menses flow. Uterus difficult to palpate but grossly normal size midline mobile nontender.  Adnexa without masses or tenderness.  Procedure: The cervix was cleansed with Betadine, anterior lip grasped with a single-tooth tenaculum, the uterus was sounded and a Mirena IUD was placed according to manufacturer's recommendations without difficulty. The strings were trimmed. The patient tolerated well and will follow up in one month for a postinsertional check.  Lot number:  [id number]    Anastasio Auerbach MD, 12:30 PM 04/06/2019

## 2019-04-06 NOTE — Patient Instructions (Signed)

## 2019-06-26 DIAGNOSIS — M9904 Segmental and somatic dysfunction of sacral region: Secondary | ICD-10-CM | POA: Diagnosis not present

## 2019-06-26 DIAGNOSIS — M62830 Muscle spasm of back: Secondary | ICD-10-CM | POA: Diagnosis not present

## 2019-06-26 DIAGNOSIS — M9903 Segmental and somatic dysfunction of lumbar region: Secondary | ICD-10-CM | POA: Diagnosis not present

## 2019-06-26 DIAGNOSIS — M545 Low back pain: Secondary | ICD-10-CM | POA: Diagnosis not present

## 2019-06-29 ENCOUNTER — Ambulatory Visit: Payer: BC Managed Care – PPO | Attending: Internal Medicine

## 2019-06-29 DIAGNOSIS — Z20822 Contact with and (suspected) exposure to covid-19: Secondary | ICD-10-CM

## 2019-07-01 LAB — NOVEL CORONAVIRUS, NAA: SARS-CoV-2, NAA: NOT DETECTED

## 2019-08-03 ENCOUNTER — Other Ambulatory Visit: Payer: Self-pay

## 2019-08-04 ENCOUNTER — Encounter: Payer: Self-pay | Admitting: Obstetrics and Gynecology

## 2019-08-04 ENCOUNTER — Ambulatory Visit: Payer: BC Managed Care – PPO | Admitting: Obstetrics and Gynecology

## 2019-08-04 VITALS — BP 134/80

## 2019-08-04 DIAGNOSIS — R102 Pelvic and perineal pain: Secondary | ICD-10-CM

## 2019-08-04 DIAGNOSIS — R202 Paresthesia of skin: Secondary | ICD-10-CM | POA: Diagnosis not present

## 2019-08-04 DIAGNOSIS — R531 Weakness: Secondary | ICD-10-CM | POA: Diagnosis not present

## 2019-08-04 DIAGNOSIS — N898 Other specified noninflammatory disorders of vagina: Secondary | ICD-10-CM

## 2019-08-04 LAB — URINALYSIS, ROUTINE W REFLEX MICROSCOPIC
Bilirubin Urine: NEGATIVE
Glucose, UA: NEGATIVE
Hgb urine dipstick: NEGATIVE
Hyaline Cast: NONE SEEN /LPF
Ketones, ur: NEGATIVE
Nitrite: NEGATIVE
Protein, ur: NEGATIVE
RBC / HPF: NONE SEEN /HPF (ref 0–2)
Specific Gravity, Urine: 1.01 (ref 1.001–1.03)
pH: 6 (ref 5.0–8.0)

## 2019-08-04 LAB — NOTE

## 2019-08-04 LAB — WET PREP FOR TRICH, YEAST, CLUE

## 2019-08-04 MED ORDER — FLUCONAZOLE 150 MG PO TABS: 150.0000 mg | ORAL_TABLET | ORAL | 0 refills | Status: DC

## 2019-08-04 NOTE — Patient Instructions (Signed)
Take the Diflucan antifungal by mouth 1 pill every 72 hours x 2 doses We will let you know about your lab results when available If the tingling sensations continue and are bothering you, and the labs are normal, then I recommend getting checked out by a primary care doctor

## 2019-08-04 NOTE — Progress Notes (Signed)
   Grace Russell  1991/05/24 [id number]  HPI The patient is a 29 y.o. G0P0000 who presents today for concerns with pelvic cramping, discharge, some odor.  She had a Mirena IUD placed 04/06/2019.  She has had a Mirena IUD for 5 years previously. Pelvic cramping intermittently for 3 weeks or so.  Some vaginal pressure worse at night.  No dysuria. Feels tingling in arms and legs, sometimes weak feeling in this last week or so.  Feels hot in hands and feet.  Never had these symptoms before.  Sexually active last more than 2 days ago, no pain with intercourse.   Denies any fever.  Denies new medications or substance use. , Past medical history,surgical history, problem list, medications, allergies, family history and social history were all reviewed and documented as reviewed in the EPIC chart.  ROS:  Feeling well. No dyspnea or chest pain on exertion.  No abdominal pain, change in bowel habits, black or bloody stools.  No urinary tract symptoms. GYN ROS:  no abnormal bleeding, pelvic pain or discharge, no breast pain or new or enlarging lumps on self exam. No neurological complaints.  Physical Exam  BP 134/80 (Cuff Size: Large)  No LMP recorded. (Menstrual status: IUD).  General: Flat affect, no acute distress, alert and oriented  PELVIC EXAM: VULVA: normal appearing vulva with no masses, tenderness or lesions, VAGINA: normal appearing vagina with normal color, +thick white discharge, no lesions, CERVIX: normal appearing cervix without discharge or lesions, IUD strings 1 cm from external os UTERUS: uterus is normal size, shape, consistency and nontender, exam limited by body habitus WET MOUNT positive for yeast, negative clue cells and trichomonas, few WBC, moderate bacteria  Assessment 29 yo G0 here for IUD surveillance, vaginal candidiasis, and parasthesias in extremities  Plan Diflucan 150 mg po x 2 doses q 72 hours IUD appears to be in correct location and temporal pattern of her symptoms do  not suggest a problem attributable to the IUD. Check CBC, BMP, TSH, glucose, and UA  If parasthesias not improving, and lab results do not show anything, I recommend getting evaluated by a primary care doctor.  Theresia Majors MD 08/04/19

## 2019-08-04 NOTE — Addendum Note (Signed)
Addended by: Theresia Majors D on: 08/04/2019 11:52 AM   Modules accepted: Level of Service

## 2019-08-05 LAB — BASIC METABOLIC PANEL
BUN: 10 mg/dL (ref 7–25)
CO2: 20 mmol/L (ref 20–32)
Calcium: 9.6 mg/dL (ref 8.6–10.2)
Chloride: 103 mmol/L (ref 98–110)
Creat: 0.75 mg/dL (ref 0.50–1.10)
Glucose, Bld: 109 mg/dL — ABNORMAL HIGH (ref 65–99)
Potassium: 3.9 mmol/L (ref 3.5–5.3)
Sodium: 136 mmol/L (ref 135–146)

## 2019-08-05 LAB — CBC
HCT: 43.4 % (ref 35.0–45.0)
Hemoglobin: 14.5 g/dL (ref 11.7–15.5)
MCH: 28.2 pg (ref 27.0–33.0)
MCHC: 33.4 g/dL (ref 32.0–36.0)
MCV: 84.4 fL (ref 80.0–100.0)
MPV: 9.2 fL (ref 7.5–12.5)
Platelets: 357 10*3/uL (ref 140–400)
RBC: 5.14 10*6/uL — ABNORMAL HIGH (ref 3.80–5.10)
RDW: 13 % (ref 11.0–15.0)
WBC: 9.1 10*3/uL (ref 3.8–10.8)

## 2019-08-05 LAB — TSH: TSH: 2.34 mIU/L

## 2019-09-26 DIAGNOSIS — F319 Bipolar disorder, unspecified: Secondary | ICD-10-CM | POA: Diagnosis not present

## 2019-09-26 DIAGNOSIS — F1411 Cocaine abuse, in remission: Secondary | ICD-10-CM | POA: Diagnosis not present

## 2019-09-26 DIAGNOSIS — F902 Attention-deficit hyperactivity disorder, combined type: Secondary | ICD-10-CM | POA: Diagnosis not present

## 2019-10-30 DIAGNOSIS — F1411 Cocaine abuse, in remission: Secondary | ICD-10-CM | POA: Diagnosis not present

## 2019-10-30 DIAGNOSIS — F902 Attention-deficit hyperactivity disorder, combined type: Secondary | ICD-10-CM | POA: Diagnosis not present

## 2019-10-30 DIAGNOSIS — F319 Bipolar disorder, unspecified: Secondary | ICD-10-CM | POA: Diagnosis not present

## 2019-11-03 DIAGNOSIS — H16223 Keratoconjunctivitis sicca, not specified as Sjogren's, bilateral: Secondary | ICD-10-CM | POA: Diagnosis not present

## 2019-11-03 DIAGNOSIS — H04123 Dry eye syndrome of bilateral lacrimal glands: Secondary | ICD-10-CM | POA: Diagnosis not present

## 2020-02-05 DIAGNOSIS — F1411 Cocaine abuse, in remission: Secondary | ICD-10-CM | POA: Diagnosis not present

## 2020-02-05 DIAGNOSIS — F902 Attention-deficit hyperactivity disorder, combined type: Secondary | ICD-10-CM | POA: Diagnosis not present

## 2020-02-05 DIAGNOSIS — F319 Bipolar disorder, unspecified: Secondary | ICD-10-CM | POA: Diagnosis not present

## 2020-04-29 DIAGNOSIS — F1411 Cocaine abuse, in remission: Secondary | ICD-10-CM | POA: Diagnosis not present

## 2020-04-29 DIAGNOSIS — F319 Bipolar disorder, unspecified: Secondary | ICD-10-CM | POA: Diagnosis not present

## 2020-04-29 DIAGNOSIS — F902 Attention-deficit hyperactivity disorder, combined type: Secondary | ICD-10-CM | POA: Diagnosis not present

## 2020-07-22 DIAGNOSIS — F1411 Cocaine abuse, in remission: Secondary | ICD-10-CM | POA: Diagnosis not present

## 2020-07-22 DIAGNOSIS — F319 Bipolar disorder, unspecified: Secondary | ICD-10-CM | POA: Diagnosis not present

## 2020-07-22 DIAGNOSIS — F902 Attention-deficit hyperactivity disorder, combined type: Secondary | ICD-10-CM | POA: Diagnosis not present

## 2020-10-14 DIAGNOSIS — F902 Attention-deficit hyperactivity disorder, combined type: Secondary | ICD-10-CM | POA: Diagnosis not present

## 2020-10-14 DIAGNOSIS — F1411 Cocaine abuse, in remission: Secondary | ICD-10-CM | POA: Diagnosis not present

## 2020-10-14 DIAGNOSIS — F319 Bipolar disorder, unspecified: Secondary | ICD-10-CM | POA: Diagnosis not present

## 2020-11-11 DIAGNOSIS — F319 Bipolar disorder, unspecified: Secondary | ICD-10-CM | POA: Diagnosis not present

## 2020-11-11 DIAGNOSIS — F1411 Cocaine abuse, in remission: Secondary | ICD-10-CM | POA: Diagnosis not present

## 2020-11-11 DIAGNOSIS — F902 Attention-deficit hyperactivity disorder, combined type: Secondary | ICD-10-CM | POA: Diagnosis not present

## 2020-11-29 DIAGNOSIS — F319 Bipolar disorder, unspecified: Secondary | ICD-10-CM | POA: Diagnosis not present

## 2020-11-29 DIAGNOSIS — R0683 Snoring: Secondary | ICD-10-CM | POA: Diagnosis not present

## 2020-12-02 DIAGNOSIS — Z713 Dietary counseling and surveillance: Secondary | ICD-10-CM | POA: Diagnosis not present

## 2020-12-04 DIAGNOSIS — F319 Bipolar disorder, unspecified: Secondary | ICD-10-CM | POA: Diagnosis not present

## 2020-12-04 DIAGNOSIS — F1411 Cocaine abuse, in remission: Secondary | ICD-10-CM | POA: Diagnosis not present

## 2020-12-04 DIAGNOSIS — F902 Attention-deficit hyperactivity disorder, combined type: Secondary | ICD-10-CM | POA: Diagnosis not present

## 2020-12-18 DIAGNOSIS — R0683 Snoring: Secondary | ICD-10-CM | POA: Diagnosis not present

## 2020-12-18 DIAGNOSIS — G4719 Other hypersomnia: Secondary | ICD-10-CM | POA: Diagnosis not present

## 2020-12-18 DIAGNOSIS — R062 Wheezing: Secondary | ICD-10-CM | POA: Diagnosis not present

## 2020-12-31 DIAGNOSIS — G4733 Obstructive sleep apnea (adult) (pediatric): Secondary | ICD-10-CM | POA: Diagnosis not present

## 2021-01-01 DIAGNOSIS — R0602 Shortness of breath: Secondary | ICD-10-CM | POA: Diagnosis not present

## 2021-01-01 DIAGNOSIS — G4733 Obstructive sleep apnea (adult) (pediatric): Secondary | ICD-10-CM | POA: Diagnosis not present

## 2021-01-01 DIAGNOSIS — R0683 Snoring: Secondary | ICD-10-CM | POA: Diagnosis not present

## 2021-01-01 DIAGNOSIS — R062 Wheezing: Secondary | ICD-10-CM | POA: Diagnosis not present

## 2021-01-02 DIAGNOSIS — F1411 Cocaine abuse, in remission: Secondary | ICD-10-CM | POA: Diagnosis not present

## 2021-01-02 DIAGNOSIS — F319 Bipolar disorder, unspecified: Secondary | ICD-10-CM | POA: Diagnosis not present

## 2021-01-02 DIAGNOSIS — F902 Attention-deficit hyperactivity disorder, combined type: Secondary | ICD-10-CM | POA: Diagnosis not present

## 2021-01-08 DIAGNOSIS — Z713 Dietary counseling and surveillance: Secondary | ICD-10-CM | POA: Diagnosis not present

## 2021-01-10 DIAGNOSIS — Z01818 Encounter for other preprocedural examination: Secondary | ICD-10-CM | POA: Diagnosis not present

## 2021-01-10 DIAGNOSIS — Z6841 Body Mass Index (BMI) 40.0 and over, adult: Secondary | ICD-10-CM | POA: Diagnosis not present

## 2021-01-10 DIAGNOSIS — G4733 Obstructive sleep apnea (adult) (pediatric): Secondary | ICD-10-CM | POA: Diagnosis not present

## 2021-01-10 DIAGNOSIS — Z13220 Encounter for screening for lipoid disorders: Secondary | ICD-10-CM | POA: Diagnosis not present

## 2021-01-10 DIAGNOSIS — Z131 Encounter for screening for diabetes mellitus: Secondary | ICD-10-CM | POA: Diagnosis not present

## 2021-01-30 DIAGNOSIS — F902 Attention-deficit hyperactivity disorder, combined type: Secondary | ICD-10-CM | POA: Diagnosis not present

## 2021-01-30 DIAGNOSIS — F1411 Cocaine abuse, in remission: Secondary | ICD-10-CM | POA: Diagnosis not present

## 2021-01-30 DIAGNOSIS — F319 Bipolar disorder, unspecified: Secondary | ICD-10-CM | POA: Diagnosis not present

## 2021-02-03 DIAGNOSIS — F54 Psychological and behavioral factors associated with disorders or diseases classified elsewhere: Secondary | ICD-10-CM | POA: Diagnosis not present

## 2021-02-03 DIAGNOSIS — Z6841 Body Mass Index (BMI) 40.0 and over, adult: Secondary | ICD-10-CM | POA: Diagnosis not present

## 2021-02-03 DIAGNOSIS — Z7189 Other specified counseling: Secondary | ICD-10-CM | POA: Diagnosis not present

## 2021-02-11 DIAGNOSIS — F319 Bipolar disorder, unspecified: Secondary | ICD-10-CM | POA: Diagnosis not present

## 2021-02-11 DIAGNOSIS — G4733 Obstructive sleep apnea (adult) (pediatric): Secondary | ICD-10-CM | POA: Diagnosis not present

## 2021-02-12 DIAGNOSIS — Z723 Lack of physical exercise: Secondary | ICD-10-CM | POA: Diagnosis not present

## 2021-02-17 DIAGNOSIS — M9903 Segmental and somatic dysfunction of lumbar region: Secondary | ICD-10-CM | POA: Diagnosis not present

## 2021-02-17 DIAGNOSIS — M9904 Segmental and somatic dysfunction of sacral region: Secondary | ICD-10-CM | POA: Diagnosis not present

## 2021-02-17 DIAGNOSIS — M62830 Muscle spasm of back: Secondary | ICD-10-CM | POA: Diagnosis not present

## 2021-02-17 DIAGNOSIS — M9901 Segmental and somatic dysfunction of cervical region: Secondary | ICD-10-CM | POA: Diagnosis not present

## 2021-02-18 DIAGNOSIS — M9901 Segmental and somatic dysfunction of cervical region: Secondary | ICD-10-CM | POA: Diagnosis not present

## 2021-02-18 DIAGNOSIS — M9904 Segmental and somatic dysfunction of sacral region: Secondary | ICD-10-CM | POA: Diagnosis not present

## 2021-02-18 DIAGNOSIS — M62830 Muscle spasm of back: Secondary | ICD-10-CM | POA: Diagnosis not present

## 2021-02-18 DIAGNOSIS — M9903 Segmental and somatic dysfunction of lumbar region: Secondary | ICD-10-CM | POA: Diagnosis not present

## 2021-02-19 DIAGNOSIS — M62830 Muscle spasm of back: Secondary | ICD-10-CM | POA: Diagnosis not present

## 2021-02-19 DIAGNOSIS — M9903 Segmental and somatic dysfunction of lumbar region: Secondary | ICD-10-CM | POA: Diagnosis not present

## 2021-02-19 DIAGNOSIS — M9901 Segmental and somatic dysfunction of cervical region: Secondary | ICD-10-CM | POA: Diagnosis not present

## 2021-02-19 DIAGNOSIS — M9904 Segmental and somatic dysfunction of sacral region: Secondary | ICD-10-CM | POA: Diagnosis not present

## 2021-02-20 DIAGNOSIS — G4733 Obstructive sleep apnea (adult) (pediatric): Secondary | ICD-10-CM | POA: Diagnosis not present

## 2021-02-26 DIAGNOSIS — M9904 Segmental and somatic dysfunction of sacral region: Secondary | ICD-10-CM | POA: Diagnosis not present

## 2021-02-26 DIAGNOSIS — M9903 Segmental and somatic dysfunction of lumbar region: Secondary | ICD-10-CM | POA: Diagnosis not present

## 2021-02-26 DIAGNOSIS — M9901 Segmental and somatic dysfunction of cervical region: Secondary | ICD-10-CM | POA: Diagnosis not present

## 2021-02-26 DIAGNOSIS — M62830 Muscle spasm of back: Secondary | ICD-10-CM | POA: Diagnosis not present

## 2021-02-27 DIAGNOSIS — M9901 Segmental and somatic dysfunction of cervical region: Secondary | ICD-10-CM | POA: Diagnosis not present

## 2021-02-27 DIAGNOSIS — M62830 Muscle spasm of back: Secondary | ICD-10-CM | POA: Diagnosis not present

## 2021-02-27 DIAGNOSIS — M9903 Segmental and somatic dysfunction of lumbar region: Secondary | ICD-10-CM | POA: Diagnosis not present

## 2021-02-27 DIAGNOSIS — M9904 Segmental and somatic dysfunction of sacral region: Secondary | ICD-10-CM | POA: Diagnosis not present

## 2021-02-28 ENCOUNTER — Emergency Department (HOSPITAL_BASED_OUTPATIENT_CLINIC_OR_DEPARTMENT_OTHER)
Admission: EM | Admit: 2021-02-28 | Discharge: 2021-02-28 | Disposition: A | Discharge status: Home / Self Care | Payer: BC Managed Care – PPO | Attending: Emergency Medicine | Admitting: Emergency Medicine

## 2021-02-28 ENCOUNTER — Other Ambulatory Visit: Payer: Self-pay

## 2021-02-28 ENCOUNTER — Encounter (HOSPITAL_BASED_OUTPATIENT_CLINIC_OR_DEPARTMENT_OTHER): Payer: Self-pay | Admitting: *Deleted

## 2021-02-28 DIAGNOSIS — R55 Syncope and collapse: Secondary | ICD-10-CM

## 2021-02-28 DIAGNOSIS — Z6841 Body Mass Index (BMI) 40.0 and over, adult: Secondary | ICD-10-CM | POA: Diagnosis not present

## 2021-02-28 DIAGNOSIS — S3992XA Unspecified injury of lower back, initial encounter: Secondary | ICD-10-CM | POA: Diagnosis not present

## 2021-02-28 DIAGNOSIS — S39012A Strain of muscle, fascia and tendon of lower back, initial encounter: Secondary | ICD-10-CM | POA: Diagnosis not present

## 2021-02-28 DIAGNOSIS — R7309 Other abnormal glucose: Secondary | ICD-10-CM | POA: Diagnosis not present

## 2021-02-28 DIAGNOSIS — R7303 Prediabetes: Secondary | ICD-10-CM | POA: Diagnosis not present

## 2021-02-28 DIAGNOSIS — M25551 Pain in right hip: Secondary | ICD-10-CM | POA: Insufficient documentation

## 2021-02-28 DIAGNOSIS — R42 Dizziness and giddiness: Secondary | ICD-10-CM | POA: Insufficient documentation

## 2021-02-28 DIAGNOSIS — M79604 Pain in right leg: Secondary | ICD-10-CM | POA: Insufficient documentation

## 2021-02-28 DIAGNOSIS — G4733 Obstructive sleep apnea (adult) (pediatric): Secondary | ICD-10-CM | POA: Diagnosis not present

## 2021-02-28 DIAGNOSIS — X500XXA Overexertion from strenuous movement or load, initial encounter: Secondary | ICD-10-CM | POA: Insufficient documentation

## 2021-02-28 LAB — CBC WITH DIFFERENTIAL/PLATELET
Abs Immature Granulocytes: 0.04 10*3/uL (ref 0.00–0.07)
Basophils Absolute: 0 10*3/uL (ref 0.0–0.1)
Basophils Relative: 0 %
Eosinophils Absolute: 0.1 10*3/uL (ref 0.0–0.5)
Eosinophils Relative: 2 %
HCT: 44.9 % (ref 36.0–46.0)
Hemoglobin: 14.9 g/dL (ref 12.0–15.0)
Immature Granulocytes: 1 %
Lymphocytes Relative: 30 %
Lymphs Abs: 2.2 10*3/uL (ref 0.7–4.0)
MCH: 28.7 pg (ref 26.0–34.0)
MCHC: 33.2 g/dL (ref 30.0–36.0)
MCV: 86.3 fL (ref 80.0–100.0)
Monocytes Absolute: 0.7 10*3/uL (ref 0.1–1.0)
Monocytes Relative: 9 %
Neutro Abs: 4.4 10*3/uL (ref 1.7–7.7)
Neutrophils Relative %: 58 %
Platelets: 404 10*3/uL — ABNORMAL HIGH (ref 150–400)
RBC: 5.2 MIL/uL — ABNORMAL HIGH (ref 3.87–5.11)
RDW: 13.2 % (ref 11.5–15.5)
WBC: 7.5 10*3/uL (ref 4.0–10.5)
nRBC: 0 % (ref 0.0–0.2)

## 2021-02-28 LAB — BASIC METABOLIC PANEL
Anion gap: 12 (ref 5–15)
BUN: 14 mg/dL (ref 6–20)
CO2: 21 mmol/L — ABNORMAL LOW (ref 22–32)
Calcium: 9.7 mg/dL (ref 8.9–10.3)
Chloride: 107 mmol/L (ref 98–111)
Creatinine, Ser: 1.03 mg/dL — ABNORMAL HIGH (ref 0.44–1.00)
GFR, Estimated: >60 mL/min (ref >60)
Glucose, Bld: 124 mg/dL — ABNORMAL HIGH (ref 70–99)
Potassium: 4.2 mmol/L (ref 3.5–5.1)
Sodium: 140 mmol/L (ref 135–145)

## 2021-02-28 LAB — CBG MONITORING, ED: Glucose-Capillary: 119 mg/dL — ABNORMAL HIGH (ref 70–99)

## 2021-02-28 LAB — HCG, QUANTITATIVE, PREGNANCY: hCG, Beta Chain, Quant, S: <1 m[IU]/mL (ref <5)

## 2021-02-28 MED ORDER — KETOROLAC TROMETHAMINE 30 MG/ML IJ SOLN
30.0000 mg | Freq: Once | INTRAMUSCULAR | Status: AC
  Administered 2021-02-28: 30 mg via INTRAVENOUS
  Filled 2021-02-28: qty 1

## 2021-02-28 MED ORDER — LIDOCAINE 5 % EX PTCH
1.0000 | MEDICATED_PATCH | CUTANEOUS | Status: DC
  Administered 2021-02-28: 1 via TRANSDERMAL
  Filled 2021-02-28: qty 1

## 2021-02-28 MED ORDER — ACETAMINOPHEN 325 MG PO TABS
650.0000 mg | ORAL_TABLET | Freq: Once | ORAL | Status: AC
  Administered 2021-02-28: 650 mg via ORAL
  Filled 2021-02-28: qty 2

## 2021-02-28 MED ORDER — SODIUM CHLORIDE 0.9 % IV BOLUS
1000.0000 mL | Freq: Once | INTRAVENOUS | Status: AC
  Administered 2021-02-28: 1000 mL via INTRAVENOUS

## 2021-02-28 MED ORDER — NAPROXEN 500 MG PO TABS: 500.0000 mg | ORAL_TABLET | Freq: Two times a day (BID) | ORAL | 0 refills | Status: DC

## 2021-02-28 MED ORDER — LIDOCAINE 5 % EX PTCH: 1.0000 | MEDICATED_PATCH | CUTANEOUS | 0 refills | Status: DC

## 2021-02-28 MED ORDER — METHOCARBAMOL 500 MG PO TABS: 500.0000 mg | ORAL_TABLET | Freq: Two times a day (BID) | ORAL | 0 refills | Status: DC

## 2021-02-28 MED ORDER — DICLOFENAC SODIUM 1 % EX GEL: 2.0000 g | Freq: Four times a day (QID) | CUTANEOUS | 0 refills | Status: DC

## 2021-02-28 NOTE — ED Notes (Signed)
Pt ambulatory at d/c but was wheeled out of ED via wheelchair. Pt verbalized understanding of d/c instructions and follow up care

## 2021-02-28 NOTE — ED Provider Notes (Signed)
[facility] EMERGENCY DEPARTMENT Provider Note   CSN: [id number] Arrival date & time: 02/28/21  1452     History Chief Complaint  Patient presents with   Back Pain    Grace Russell is a 30 y.o. female with a past medical history of anxiety, depression, bipolar disorder, obesity presenting to the ED with a chief complaint of back pain.  Reports for the past 4 days she has had right lower back and hip pain radiating down her right leg.  She said that last week she was doing heavy lifting and sitting on a hardwood floor which she feels may have caused it.  She has been taking NSAIDs with only minimal improvement in her symptoms.  She has had back pain like this in the past but has not persisted this long.  Denies any injury or trauma.  No prior back surgeries, numbness, saddle anesthesia, dysuria, chest pain, fever.  Pain is worse with ambulation and movement.  While in the waiting room patient states that she became suddenly flushed, lightheaded, dizzy.  This is happened to her in the past "when I am in a medical setting."  She is unsure if is related to low blood sugar from her prediabetes or from "just being nervous about being here."  She last ate a meal about 3 hours ago.  States that she is starting to feel better now.  Denies any preceding or current headache, chest pain or blurry vision.  Did not lose consciousness   Back Pain Associated symptoms: no abdominal pain, no chest pain, no dysuria, no fever and no weakness       Past Medical History:  Diagnosis Date   Anxiety    Bipolar 2 disorder [facility])    Depression     Patient Active Problem List   Diagnosis Date Noted   Morbid obesity (HCC) 10/04/2018   Depression 07/11/2012   History of recurrent UTIs 07/11/2012    Past Surgical History:  Procedure Laterality Date   INTRAUTERINE DEVICE INSERTION  04/06/2019   WISDOM TOOTH EXTRACTION       OB History     Gravida  0   Para      Term      Preterm      AB   0   Living  0      SAB      IAB      Ectopic  0   Multiple      Live Births              Family History  Problem Relation Age of Onset   Heart disease Father    Diabetes Father    Heart disease Paternal Grandfather     Social History   Tobacco Use   Smoking status: Never   Smokeless tobacco: Never  Vaping Use   Vaping Use: Never used  Substance Use Topics   Alcohol use: Yes    Comment: 5-6 drinks a week   Drug use: No    Home Medications Prior to Admission medications   Medication Sig Start Date End Date Taking? Authorizing Provider  diclofenac Sodium (VOLTAREN) 1 % GEL Apply 2 g topically 4 (four) times daily. 02/28/21  Yes Naseem Varden, PA-C  lidocaine (LIDODERM) 5 % Place 1 patch onto the skin daily. Remove & Discard patch within 12 hours or as directed by MD 02/28/21  Yes Clare Fennimore, PA-C  methocarbamol (ROBAXIN) 500 MG tablet Take 1 tablet (500 mg total) by  mouth 2 (two) times daily. 02/28/21  Yes Cieara Stierwalt, PA-C  naproxen (NAPROSYN) 500 MG tablet Take 1 tablet (500 mg total) by mouth 2 (two) times daily. 02/28/21  Yes Rohan Juenger, PA-C  Armodafinil 150 MG tablet Take 150 mg by mouth daily.    [provider]  fluconazole (DIFLUCAN) 150 MG tablet Take 1 tablet (150 mg total) by mouth every 3 (three) days. 08/04/19   Theresia Majors, MD  lamoTRIgine (LAMICTAL) 100 MG tablet Take 100 mg by mouth daily.    [provider]    Allergies    Mushroom extract complex, Other, Shellfish allergy, and Prozac [fluoxetine hcl]  Review of Systems   Review of Systems  Constitutional:  Negative for appetite change, chills and fever.  HENT:  Negative for ear pain, rhinorrhea, sneezing and sore throat.   Eyes:  Negative for photophobia and visual disturbance.  Respiratory:  Negative for cough, chest tightness, shortness of breath and wheezing.   Cardiovascular:  Negative for chest pain and palpitations.  Gastrointestinal:  Negative for abdominal pain,  blood in stool, constipation, diarrhea, nausea and vomiting.  Genitourinary:  Negative for dysuria, hematuria and urgency.  Musculoskeletal:  Positive for back pain. Negative for myalgias.  Skin:  Negative for rash.  Neurological:  Positive for dizziness and light-headedness. Negative for weakness.   Physical Exam Updated Vital Signs BP 128/86   Pulse 90   Temp 98.3 F (36.8 C) (Oral)   Resp (!) 27   Ht 5\' 4"  (1.626 m)   Wt (!) 149.7 kg   SpO2 97%   BMI 56.64 kg/m   Physical Exam Vitals and nursing note reviewed.  Constitutional:      General: She is not in acute distress.    Appearance: She is well-developed.  HENT:     Head: Normocephalic and atraumatic.     Nose: Nose normal.  Eyes:     General: No scleral icterus.       Right eye: No discharge.        Left eye: No discharge.     Conjunctiva/sclera: Conjunctivae normal.     Pupils: Pupils are equal, round, and reactive to light.  Cardiovascular:     Rate and Rhythm: Normal rate and regular rhythm.     Heart sounds: Normal heart sounds. No murmur heard.   No friction rub. No gallop.  Pulmonary:     Effort: Pulmonary effort is normal. No respiratory distress.     Breath sounds: Normal breath sounds.  Abdominal:     General: Bowel sounds are normal. There is no distension.     Palpations: Abdomen is soft.     Tenderness: There is no abdominal tenderness. There is no guarding.  Musculoskeletal:        General: Tenderness present. Normal range of motion.     Cervical back: Normal range of motion and neck supple.     Comments: Tenderness palpation of the right paraspinal musculature of the lumbar spine.  Strength 5/5 in bilateral upper and lower extremities.  Normal sensation to light touch of bilateral upper and lower extremities.  Pain with range of motion of the right lower extremity without deformities or overlying skin changes.  Skin:    General: Skin is warm and dry.     Findings: No rash.  Neurological:      Mental Status: She is alert and oriented to person, place, and time.     Cranial Nerves: No cranial nerve deficit.  Sensory: No sensory deficit.     Motor: No weakness or abnormal muscle tone.     Coordination: Coordination normal.    ED Results / Procedures / Treatments   Labs (all labs ordered are listed, but only abnormal results are displayed) Labs Reviewed  BASIC METABOLIC PANEL - Abnormal; Notable for the following components:      Result Value   CO2 21 (*)    Glucose, Bld 124 (*)    Creatinine, Ser 1.03 (*)    All other components within normal limits  CBC WITH DIFFERENTIAL/PLATELET - Abnormal; Notable for the following components:   RBC 5.20 (*)    Platelets 404 (*)    All other components within normal limits  CBG MONITORING, ED - Abnormal; Notable for the following components:   Glucose-Capillary 119 (*)    All other components within normal limits  HCG, QUANTITATIVE, PREGNANCY    EKG None  Radiology No results found.  Procedures Procedures   Medications Ordered in ED Medications  lidocaine (LIDODERM) 5 % 1 patch (1 patch Transdermal Patch Applied 02/28/21 1800)  sodium chloride 0.9 % bolus 1,000 mL (0 mLs Intravenous Stopped 02/28/21 1850)  ketorolac (TORADOL) 30 MG/ML injection 30 mg (30 mg Intravenous Given 02/28/21 1800)  acetaminophen (TYLENOL) tablet 650 mg (650 mg Oral Given 02/28/21 1800)    ED Course  I have reviewed the triage vital signs and the nursing notes.  Pertinent labs & imaging results that were available during my care of the patient were reviewed by me and considered in my medical decision making (see chart for details).  Clinical Course as of 02/28/21 1938  Fri Feb 28, 2021  1644 Glucose-Capillary(!): 119 [HK]  1644 BP: 109/72 [HK]  1708 Creatinine(!): 1.03 [HK]  1708 WBC: 7.5 [HK]  1855 RN reports that patient ambulated well here. [HK]    Clinical Course User Index [HK] Dietrich Pates, PA-C   MDM Rules/Calculators/A&P                            30 year old female presenting to the ED for back pain.  Right lower back pain radiating down her right leg for the past 4 days.  Heavy lifting and constant sitting on a hardwood floor last week which she believes may have caused the symptoms.  History of back pain in the past but this has persisted for longer.  Minimal improvement with NSAIDs. In the ER waiting room patient became flushed, lightheaded and dizzy.  States that this is happened her before in a medical setting.  Denies any preceding headache, chest pain or vision changes.  She is improved by the time I evaluated her in the exam room.  She has no neurological deficits.  She has tenderness palpation of the lumbar paraspinal musculature on the right side without any objective signs of numbness on lower extremities.  Equal and intact distal pulses of bilateral lower extremities.  She became hypotensive to 80 systolic in the waiting room.  By the time I saw her in the exam room her systolic blood pressure was 107.  I will order lab work and give IV fluids.  Work-up here including CBC, BMP is unremarkable.  Her pregnancy test is negative.  Normal CBG here.  Her blood pressure has improved with rest and IV fluids.  I suspect that her back pain is musculoskeletal in nature.  I doubt cauda equina.  She has no weakness or objective signs of numbness  demonstrated on exam.  Doubt dissection based on her physical exam findings.  She is ambulating here.  She reports pain but she appears overall comfortable without any neurological deficits.  Her vitals have improved.  I suspect that this was a brief vagal episode.  Will treat with NSAIDs, muscle relaxer.  Return precautions given.   Patient is hemodynamically stable, in NAD, and able to ambulate in the ED. Evaluation does not show pathology that would require ongoing emergent intervention or inpatient treatment. I explained the diagnosis to the patient. Pain has been managed and has no  complaints prior to discharge. Patient is comfortable with above plan and is stable for discharge at this time. All questions were answered prior to disposition. Strict return precautions for returning to the ED were discussed. Encouraged follow up with PCP.   An After Visit Summary was printed and given to the patient.   Portions of this note were generated with Scientist, clinical (histocompatibility and immunogenetics)Dragon dictation software. Dictation errors may occur despite best attempts at proofreading.  Final Clinical Impression(s) / ED Diagnoses Final diagnoses:  Strain of lumbar region, initial encounter  Near syncope    Rx / DC Orders ED Discharge Orders          Ordered    diclofenac Sodium (VOLTAREN) 1 % GEL  4 times daily        02/28/21 1937    naproxen (NAPROSYN) 500 MG tablet  2 times daily        02/28/21 1937    methocarbamol (ROBAXIN) 500 MG tablet  2 times daily        02/28/21 1937    lidocaine (LIDODERM) 5 %  Every 24 hours        02/28/21 1937             Dietrich PatesKhatri, Dinisha Cai, PA-C 02/28/21 1938    Rolan BuccoBelfi, Melanie, MD 02/28/21 2315

## 2021-02-28 NOTE — Discharge Instructions (Addendum)
Take medication stop with your symptoms. Make sure you are drinking plenty of fluids and following up with your primary care provider. Return to the ER if you start to experience worsening pain, injuries or falls, numbness in your groin area, numbness in your legs or fever.

## 2021-02-28 NOTE — ED Triage Notes (Signed)
C/o right lower back pain which radiates down right leg x 3 days

## 2021-02-28 NOTE — ED Notes (Addendum)
Pt reports feeling weak , pt brought into triage for EKG , BP 80/40  pt diaphoretic , pt taken to rm 1

## 2021-03-03 DIAGNOSIS — M9903 Segmental and somatic dysfunction of lumbar region: Secondary | ICD-10-CM | POA: Diagnosis not present

## 2021-03-03 DIAGNOSIS — M9904 Segmental and somatic dysfunction of sacral region: Secondary | ICD-10-CM | POA: Diagnosis not present

## 2021-03-03 DIAGNOSIS — M9901 Segmental and somatic dysfunction of cervical region: Secondary | ICD-10-CM | POA: Diagnosis not present

## 2021-03-03 DIAGNOSIS — M62830 Muscle spasm of back: Secondary | ICD-10-CM | POA: Diagnosis not present

## 2021-03-04 DIAGNOSIS — M9903 Segmental and somatic dysfunction of lumbar region: Secondary | ICD-10-CM | POA: Diagnosis not present

## 2021-03-04 DIAGNOSIS — M62830 Muscle spasm of back: Secondary | ICD-10-CM | POA: Diagnosis not present

## 2021-03-04 DIAGNOSIS — M9904 Segmental and somatic dysfunction of sacral region: Secondary | ICD-10-CM | POA: Diagnosis not present

## 2021-03-04 DIAGNOSIS — M9901 Segmental and somatic dysfunction of cervical region: Secondary | ICD-10-CM | POA: Diagnosis not present

## 2021-03-05 DIAGNOSIS — M9904 Segmental and somatic dysfunction of sacral region: Secondary | ICD-10-CM | POA: Diagnosis not present

## 2021-03-05 DIAGNOSIS — M9901 Segmental and somatic dysfunction of cervical region: Secondary | ICD-10-CM | POA: Diagnosis not present

## 2021-03-05 DIAGNOSIS — F1411 Cocaine abuse, in remission: Secondary | ICD-10-CM | POA: Diagnosis not present

## 2021-03-05 DIAGNOSIS — F319 Bipolar disorder, unspecified: Secondary | ICD-10-CM | POA: Diagnosis not present

## 2021-03-05 DIAGNOSIS — M62830 Muscle spasm of back: Secondary | ICD-10-CM | POA: Diagnosis not present

## 2021-03-05 DIAGNOSIS — F902 Attention-deficit hyperactivity disorder, combined type: Secondary | ICD-10-CM | POA: Diagnosis not present

## 2021-03-05 DIAGNOSIS — M9903 Segmental and somatic dysfunction of lumbar region: Secondary | ICD-10-CM | POA: Diagnosis not present

## 2021-03-06 DIAGNOSIS — M545 Low back pain, unspecified: Secondary | ICD-10-CM | POA: Diagnosis not present

## 2021-03-06 DIAGNOSIS — M25551 Pain in right hip: Secondary | ICD-10-CM | POA: Diagnosis not present

## 2021-03-11 DIAGNOSIS — M9904 Segmental and somatic dysfunction of sacral region: Secondary | ICD-10-CM | POA: Diagnosis not present

## 2021-03-11 DIAGNOSIS — M9903 Segmental and somatic dysfunction of lumbar region: Secondary | ICD-10-CM | POA: Diagnosis not present

## 2021-03-11 DIAGNOSIS — M62830 Muscle spasm of back: Secondary | ICD-10-CM | POA: Diagnosis not present

## 2021-03-11 DIAGNOSIS — M9901 Segmental and somatic dysfunction of cervical region: Secondary | ICD-10-CM | POA: Diagnosis not present

## 2021-03-12 DIAGNOSIS — M62830 Muscle spasm of back: Secondary | ICD-10-CM | POA: Diagnosis not present

## 2021-03-12 DIAGNOSIS — M9901 Segmental and somatic dysfunction of cervical region: Secondary | ICD-10-CM | POA: Diagnosis not present

## 2021-03-12 DIAGNOSIS — M9903 Segmental and somatic dysfunction of lumbar region: Secondary | ICD-10-CM | POA: Diagnosis not present

## 2021-03-12 DIAGNOSIS — M9904 Segmental and somatic dysfunction of sacral region: Secondary | ICD-10-CM | POA: Diagnosis not present

## 2021-03-17 DIAGNOSIS — M9904 Segmental and somatic dysfunction of sacral region: Secondary | ICD-10-CM | POA: Diagnosis not present

## 2021-03-17 DIAGNOSIS — M9901 Segmental and somatic dysfunction of cervical region: Secondary | ICD-10-CM | POA: Diagnosis not present

## 2021-03-17 DIAGNOSIS — M9903 Segmental and somatic dysfunction of lumbar region: Secondary | ICD-10-CM | POA: Diagnosis not present

## 2021-03-17 DIAGNOSIS — M62830 Muscle spasm of back: Secondary | ICD-10-CM | POA: Diagnosis not present

## 2021-03-18 DIAGNOSIS — M9904 Segmental and somatic dysfunction of sacral region: Secondary | ICD-10-CM | POA: Diagnosis not present

## 2021-03-18 DIAGNOSIS — M62830 Muscle spasm of back: Secondary | ICD-10-CM | POA: Diagnosis not present

## 2021-03-18 DIAGNOSIS — M9903 Segmental and somatic dysfunction of lumbar region: Secondary | ICD-10-CM | POA: Diagnosis not present

## 2021-03-18 DIAGNOSIS — M9901 Segmental and somatic dysfunction of cervical region: Secondary | ICD-10-CM | POA: Diagnosis not present

## 2021-03-19 DIAGNOSIS — M9903 Segmental and somatic dysfunction of lumbar region: Secondary | ICD-10-CM | POA: Diagnosis not present

## 2021-03-19 DIAGNOSIS — M62830 Muscle spasm of back: Secondary | ICD-10-CM | POA: Diagnosis not present

## 2021-03-19 DIAGNOSIS — M9901 Segmental and somatic dysfunction of cervical region: Secondary | ICD-10-CM | POA: Diagnosis not present

## 2021-03-19 DIAGNOSIS — M9904 Segmental and somatic dysfunction of sacral region: Secondary | ICD-10-CM | POA: Diagnosis not present

## 2021-03-22 DIAGNOSIS — G4733 Obstructive sleep apnea (adult) (pediatric): Secondary | ICD-10-CM | POA: Diagnosis not present

## 2021-03-24 DIAGNOSIS — M9901 Segmental and somatic dysfunction of cervical region: Secondary | ICD-10-CM | POA: Diagnosis not present

## 2021-03-24 DIAGNOSIS — M9904 Segmental and somatic dysfunction of sacral region: Secondary | ICD-10-CM | POA: Diagnosis not present

## 2021-03-24 DIAGNOSIS — M62830 Muscle spasm of back: Secondary | ICD-10-CM | POA: Diagnosis not present

## 2021-03-24 DIAGNOSIS — M9903 Segmental and somatic dysfunction of lumbar region: Secondary | ICD-10-CM | POA: Diagnosis not present

## 2021-03-26 ENCOUNTER — Ambulatory Visit (INDEPENDENT_AMBULATORY_CARE_PROVIDER_SITE_OTHER): Payer: BC Managed Care – PPO | Admitting: Nurse Practitioner

## 2021-03-26 ENCOUNTER — Other Ambulatory Visit (HOSPITAL_COMMUNITY)
Admission: RE | Admit: 2021-03-26 | Discharge: 2021-03-26 | Disposition: A | Discharge status: Home / Self Care | Payer: BC Managed Care – PPO | Source: Ambulatory Visit | Attending: Nurse Practitioner | Admitting: Nurse Practitioner

## 2021-03-26 ENCOUNTER — Encounter: Payer: Self-pay | Admitting: Nurse Practitioner

## 2021-03-26 ENCOUNTER — Other Ambulatory Visit: Payer: Self-pay

## 2021-03-26 VITALS — BP 140/84 | Ht 63.5 in | Wt 318.0 lb

## 2021-03-26 DIAGNOSIS — Z30431 Encounter for routine checking of intrauterine contraceptive device: Secondary | ICD-10-CM

## 2021-03-26 DIAGNOSIS — M62830 Muscle spasm of back: Secondary | ICD-10-CM | POA: Diagnosis not present

## 2021-03-26 DIAGNOSIS — Z01419 Encounter for gynecological examination (general) (routine) without abnormal findings: Secondary | ICD-10-CM | POA: Diagnosis not present

## 2021-03-26 DIAGNOSIS — M9903 Segmental and somatic dysfunction of lumbar region: Secondary | ICD-10-CM | POA: Diagnosis not present

## 2021-03-26 DIAGNOSIS — M9904 Segmental and somatic dysfunction of sacral region: Secondary | ICD-10-CM | POA: Diagnosis not present

## 2021-03-26 DIAGNOSIS — M9901 Segmental and somatic dysfunction of cervical region: Secondary | ICD-10-CM | POA: Diagnosis not present

## 2021-03-26 NOTE — Progress Notes (Signed)
   Grace Russell 03-08-1991 [id number]   History:  30 y.o. G0 presents for annual exam. Amenorrheic/Mirena IUD 03/2019. Normal pap history. Received Gardasil series. Bipolar disorder managed by PCP and therapist. Planning on having bariatric surgery.   Gynecologic History No LMP recorded. (Menstrual status: IUD).   Contraception/Family planning: IUD Sexually active: Yes  Health Maintenance Last Pap: 10/04/2018. Results were: Normal Last mammogram: Not indicated Last colonoscopy: Not indicated Last Dexa: Not indicated  Past medical history, past surgical history, family history and social history were all reviewed and documented in the EPIC chart. Works at [employer].   ROS:  A ROS was performed and pertinent positives and negatives are included.  Exam:  Vitals:   03/26/21 1519  BP: 140/84  Weight: (!) 318 lb (144.2 kg)  Height: 5' 3.5" (1.613 m)   Body mass index is 55.45 kg/m.  General appearance:  Normal Thyroid:  Symmetrical, normal in size, without palpable masses or nodularity. Respiratory  Auscultation:  Clear without wheezing or rhonchi Cardiovascular  Auscultation:  Regular rate, without rubs, murmurs or gallops  Edema/varicosities:  Not grossly evident Abdominal  Soft,nontender, without masses, guarding or rebound.  Liver/spleen:  No organomegaly noted  Hernia:  None appreciated  Skin  Inspection:  Grossly normal Breasts: Examined lying and sitting.   Right: Without masses, retractions, nipple discharge or axillary adenopathy.   Left: Without masses, retractions, nipple discharge or axillary adenopathy. Genitourinary   Inguinal/mons:  Normal without inguinal adenopathy  External genitalia:  Normal appearing vulva with no masses, tenderness, or lesions  BUS/Urethra/Skene's glands:  Normal  Vagina:  Normal appearing with normal color and discharge, no lesions  Cervix:  Normal appearing without discharge or lesions  Uterus:  Difficult to palpate due to  body habitus but no gross masses or tenderness  Adnexa/parametria:     Rt: Normal in size, without masses or tenderness.   Lt: Normal in size, without masses or tenderness.  Anus and perineum: Normal  Patient informed chaperone available to be present for breast and pelvic exam. Patient has requested no chaperone to be present. Patient has been advised what will be completed during breast and pelvic exam.   Assessment/Plan:  30 y.o. G0 for annual exam.   Well female exam with routine gynecological exam - Plan: Cytology - PAP( [facility]). Education provided on SBEs, importance of preventative screenings, current guidelines, high calcium diet, regular exercise, and multivitamin daily.    Encounter for routine checking of intrauterine contraceptive device (IUD) - Mirena inserted 03/2019.   Screening for cervical cancer - Normal Pap history.  Pap today.   Return in 1 year for annual.   Grace Mackie DNP, 4:19 PM 03/26/2021

## 2021-03-27 DIAGNOSIS — F319 Bipolar disorder, unspecified: Secondary | ICD-10-CM | POA: Diagnosis not present

## 2021-03-27 LAB — CYTOLOGY - PAP
Adequacy: ABSENT
Diagnosis: NEGATIVE

## 2021-04-01 DIAGNOSIS — M9904 Segmental and somatic dysfunction of sacral region: Secondary | ICD-10-CM | POA: Diagnosis not present

## 2021-04-01 DIAGNOSIS — M62830 Muscle spasm of back: Secondary | ICD-10-CM | POA: Diagnosis not present

## 2021-04-01 DIAGNOSIS — M9903 Segmental and somatic dysfunction of lumbar region: Secondary | ICD-10-CM | POA: Diagnosis not present

## 2021-04-01 DIAGNOSIS — M9901 Segmental and somatic dysfunction of cervical region: Secondary | ICD-10-CM | POA: Diagnosis not present

## 2021-04-02 DIAGNOSIS — M9904 Segmental and somatic dysfunction of sacral region: Secondary | ICD-10-CM | POA: Diagnosis not present

## 2021-04-02 DIAGNOSIS — M9903 Segmental and somatic dysfunction of lumbar region: Secondary | ICD-10-CM | POA: Diagnosis not present

## 2021-04-02 DIAGNOSIS — M9901 Segmental and somatic dysfunction of cervical region: Secondary | ICD-10-CM | POA: Diagnosis not present

## 2021-04-02 DIAGNOSIS — M62830 Muscle spasm of back: Secondary | ICD-10-CM | POA: Diagnosis not present

## 2021-04-07 DIAGNOSIS — Z6841 Body Mass Index (BMI) 40.0 and over, adult: Secondary | ICD-10-CM | POA: Diagnosis not present

## 2021-04-07 DIAGNOSIS — Z9989 Dependence on other enabling machines and devices: Secondary | ICD-10-CM | POA: Diagnosis not present

## 2021-04-07 DIAGNOSIS — R0602 Shortness of breath: Secondary | ICD-10-CM | POA: Diagnosis not present

## 2021-04-07 DIAGNOSIS — G4733 Obstructive sleep apnea (adult) (pediatric): Secondary | ICD-10-CM | POA: Diagnosis not present

## 2021-04-09 DIAGNOSIS — M9901 Segmental and somatic dysfunction of cervical region: Secondary | ICD-10-CM | POA: Diagnosis not present

## 2021-04-09 DIAGNOSIS — M62830 Muscle spasm of back: Secondary | ICD-10-CM | POA: Diagnosis not present

## 2021-04-09 DIAGNOSIS — M9904 Segmental and somatic dysfunction of sacral region: Secondary | ICD-10-CM | POA: Diagnosis not present

## 2021-04-09 DIAGNOSIS — M9903 Segmental and somatic dysfunction of lumbar region: Secondary | ICD-10-CM | POA: Diagnosis not present

## 2021-04-15 DIAGNOSIS — F54 Psychological and behavioral factors associated with disorders or diseases classified elsewhere: Secondary | ICD-10-CM | POA: Diagnosis not present

## 2021-04-15 DIAGNOSIS — Z7189 Other specified counseling: Secondary | ICD-10-CM | POA: Diagnosis not present

## 2021-04-15 DIAGNOSIS — Z6841 Body Mass Index (BMI) 40.0 and over, adult: Secondary | ICD-10-CM | POA: Diagnosis not present

## 2021-04-16 DIAGNOSIS — F1411 Cocaine abuse, in remission: Secondary | ICD-10-CM | POA: Diagnosis not present

## 2021-04-16 DIAGNOSIS — F319 Bipolar disorder, unspecified: Secondary | ICD-10-CM | POA: Diagnosis not present

## 2021-04-16 DIAGNOSIS — F902 Attention-deficit hyperactivity disorder, combined type: Secondary | ICD-10-CM | POA: Diagnosis not present

## 2021-04-22 DIAGNOSIS — G4733 Obstructive sleep apnea (adult) (pediatric): Secondary | ICD-10-CM | POA: Diagnosis not present

## 2021-04-23 DIAGNOSIS — M9904 Segmental and somatic dysfunction of sacral region: Secondary | ICD-10-CM | POA: Diagnosis not present

## 2021-04-23 DIAGNOSIS — M9901 Segmental and somatic dysfunction of cervical region: Secondary | ICD-10-CM | POA: Diagnosis not present

## 2021-04-23 DIAGNOSIS — M9903 Segmental and somatic dysfunction of lumbar region: Secondary | ICD-10-CM | POA: Diagnosis not present

## 2021-04-23 DIAGNOSIS — M62830 Muscle spasm of back: Secondary | ICD-10-CM | POA: Diagnosis not present

## 2021-04-28 DIAGNOSIS — M9904 Segmental and somatic dysfunction of sacral region: Secondary | ICD-10-CM | POA: Diagnosis not present

## 2021-04-28 DIAGNOSIS — M9901 Segmental and somatic dysfunction of cervical region: Secondary | ICD-10-CM | POA: Diagnosis not present

## 2021-04-28 DIAGNOSIS — M62830 Muscle spasm of back: Secondary | ICD-10-CM | POA: Diagnosis not present

## 2021-04-28 DIAGNOSIS — M9903 Segmental and somatic dysfunction of lumbar region: Secondary | ICD-10-CM | POA: Diagnosis not present

## 2021-05-05 DIAGNOSIS — M9901 Segmental and somatic dysfunction of cervical region: Secondary | ICD-10-CM | POA: Diagnosis not present

## 2021-05-05 DIAGNOSIS — M62830 Muscle spasm of back: Secondary | ICD-10-CM | POA: Diagnosis not present

## 2021-05-05 DIAGNOSIS — M9904 Segmental and somatic dysfunction of sacral region: Secondary | ICD-10-CM | POA: Diagnosis not present

## 2021-05-05 DIAGNOSIS — M9903 Segmental and somatic dysfunction of lumbar region: Secondary | ICD-10-CM | POA: Diagnosis not present

## 2021-05-06 DIAGNOSIS — G4733 Obstructive sleep apnea (adult) (pediatric): Secondary | ICD-10-CM | POA: Diagnosis not present

## 2021-05-06 DIAGNOSIS — R7303 Prediabetes: Secondary | ICD-10-CM | POA: Diagnosis not present

## 2021-05-06 DIAGNOSIS — F319 Bipolar disorder, unspecified: Secondary | ICD-10-CM | POA: Diagnosis not present

## 2021-05-07 DIAGNOSIS — M9904 Segmental and somatic dysfunction of sacral region: Secondary | ICD-10-CM | POA: Diagnosis not present

## 2021-05-07 DIAGNOSIS — M62830 Muscle spasm of back: Secondary | ICD-10-CM | POA: Diagnosis not present

## 2021-05-07 DIAGNOSIS — M9903 Segmental and somatic dysfunction of lumbar region: Secondary | ICD-10-CM | POA: Diagnosis not present

## 2021-05-07 DIAGNOSIS — M9901 Segmental and somatic dysfunction of cervical region: Secondary | ICD-10-CM | POA: Diagnosis not present

## 2021-05-12 DIAGNOSIS — M9901 Segmental and somatic dysfunction of cervical region: Secondary | ICD-10-CM | POA: Diagnosis not present

## 2021-05-12 DIAGNOSIS — M9904 Segmental and somatic dysfunction of sacral region: Secondary | ICD-10-CM | POA: Diagnosis not present

## 2021-05-12 DIAGNOSIS — M9903 Segmental and somatic dysfunction of lumbar region: Secondary | ICD-10-CM | POA: Diagnosis not present

## 2021-05-12 DIAGNOSIS — M62830 Muscle spasm of back: Secondary | ICD-10-CM | POA: Diagnosis not present

## 2021-05-13 DIAGNOSIS — F902 Attention-deficit hyperactivity disorder, combined type: Secondary | ICD-10-CM | POA: Diagnosis not present

## 2021-05-13 DIAGNOSIS — F319 Bipolar disorder, unspecified: Secondary | ICD-10-CM | POA: Diagnosis not present

## 2021-05-13 DIAGNOSIS — F1411 Cocaine abuse, in remission: Secondary | ICD-10-CM | POA: Diagnosis not present

## 2021-05-20 DIAGNOSIS — M9901 Segmental and somatic dysfunction of cervical region: Secondary | ICD-10-CM | POA: Diagnosis not present

## 2021-05-20 DIAGNOSIS — M9904 Segmental and somatic dysfunction of sacral region: Secondary | ICD-10-CM | POA: Diagnosis not present

## 2021-05-20 DIAGNOSIS — M62830 Muscle spasm of back: Secondary | ICD-10-CM | POA: Diagnosis not present

## 2021-05-20 DIAGNOSIS — M9903 Segmental and somatic dysfunction of lumbar region: Secondary | ICD-10-CM | POA: Diagnosis not present

## 2021-05-22 DIAGNOSIS — G4733 Obstructive sleep apnea (adult) (pediatric): Secondary | ICD-10-CM | POA: Diagnosis not present

## 2021-05-22 HISTORY — PX: LAPAROSCOPIC GASTRIC SLEEVE RESECTION: SHX5895

## 2021-05-26 DIAGNOSIS — M9903 Segmental and somatic dysfunction of lumbar region: Secondary | ICD-10-CM | POA: Diagnosis not present

## 2021-05-26 DIAGNOSIS — M62830 Muscle spasm of back: Secondary | ICD-10-CM | POA: Diagnosis not present

## 2021-05-26 DIAGNOSIS — M9901 Segmental and somatic dysfunction of cervical region: Secondary | ICD-10-CM | POA: Diagnosis not present

## 2021-05-26 DIAGNOSIS — M9904 Segmental and somatic dysfunction of sacral region: Secondary | ICD-10-CM | POA: Diagnosis not present

## 2021-05-28 DIAGNOSIS — M9903 Segmental and somatic dysfunction of lumbar region: Secondary | ICD-10-CM | POA: Diagnosis not present

## 2021-05-28 DIAGNOSIS — M62830 Muscle spasm of back: Secondary | ICD-10-CM | POA: Diagnosis not present

## 2021-05-28 DIAGNOSIS — M9904 Segmental and somatic dysfunction of sacral region: Secondary | ICD-10-CM | POA: Diagnosis not present

## 2021-05-28 DIAGNOSIS — M9901 Segmental and somatic dysfunction of cervical region: Secondary | ICD-10-CM | POA: Diagnosis not present

## 2021-05-30 DIAGNOSIS — Z01818 Encounter for other preprocedural examination: Secondary | ICD-10-CM | POA: Diagnosis not present

## 2021-06-02 DIAGNOSIS — M9904 Segmental and somatic dysfunction of sacral region: Secondary | ICD-10-CM | POA: Diagnosis not present

## 2021-06-02 DIAGNOSIS — M9903 Segmental and somatic dysfunction of lumbar region: Secondary | ICD-10-CM | POA: Diagnosis not present

## 2021-06-02 DIAGNOSIS — M62830 Muscle spasm of back: Secondary | ICD-10-CM | POA: Diagnosis not present

## 2021-06-02 DIAGNOSIS — M9901 Segmental and somatic dysfunction of cervical region: Secondary | ICD-10-CM | POA: Diagnosis not present

## 2021-06-03 DIAGNOSIS — M9904 Segmental and somatic dysfunction of sacral region: Secondary | ICD-10-CM | POA: Diagnosis not present

## 2021-06-03 DIAGNOSIS — M9903 Segmental and somatic dysfunction of lumbar region: Secondary | ICD-10-CM | POA: Diagnosis not present

## 2021-06-03 DIAGNOSIS — M9901 Segmental and somatic dysfunction of cervical region: Secondary | ICD-10-CM | POA: Diagnosis not present

## 2021-06-03 DIAGNOSIS — M62830 Muscle spasm of back: Secondary | ICD-10-CM | POA: Diagnosis not present

## 2021-06-04 DIAGNOSIS — Z91013 Allergy to seafood: Secondary | ICD-10-CM | POA: Diagnosis not present

## 2021-06-04 DIAGNOSIS — Z9151 Personal history of suicidal behavior: Secondary | ICD-10-CM | POA: Diagnosis not present

## 2021-06-04 DIAGNOSIS — Z9989 Dependence on other enabling machines and devices: Secondary | ICD-10-CM | POA: Diagnosis not present

## 2021-06-04 DIAGNOSIS — G4733 Obstructive sleep apnea (adult) (pediatric): Secondary | ICD-10-CM | POA: Diagnosis not present

## 2021-06-04 DIAGNOSIS — F319 Bipolar disorder, unspecified: Secondary | ICD-10-CM | POA: Diagnosis not present

## 2021-06-04 DIAGNOSIS — Z79899 Other long term (current) drug therapy: Secondary | ICD-10-CM | POA: Diagnosis not present

## 2021-06-04 DIAGNOSIS — J3081 Allergic rhinitis due to animal (cat) (dog) hair and dander: Secondary | ICD-10-CM | POA: Diagnosis not present

## 2021-06-04 DIAGNOSIS — Z6841 Body Mass Index (BMI) 40.0 and over, adult: Secondary | ICD-10-CM | POA: Diagnosis not present

## 2021-06-04 DIAGNOSIS — R7303 Prediabetes: Secondary | ICD-10-CM | POA: Diagnosis not present

## 2021-06-04 DIAGNOSIS — Z91018 Allergy to other foods: Secondary | ICD-10-CM | POA: Diagnosis not present

## 2021-06-04 DIAGNOSIS — Z888 Allergy status to other drugs, medicaments and biological substances status: Secondary | ICD-10-CM | POA: Diagnosis not present

## 2021-06-10 DIAGNOSIS — F319 Bipolar disorder, unspecified: Secondary | ICD-10-CM | POA: Diagnosis not present

## 2021-06-10 DIAGNOSIS — F1411 Cocaine abuse, in remission: Secondary | ICD-10-CM | POA: Diagnosis not present

## 2021-06-10 DIAGNOSIS — F902 Attention-deficit hyperactivity disorder, combined type: Secondary | ICD-10-CM | POA: Diagnosis not present

## 2021-07-03 DIAGNOSIS — Z713 Dietary counseling and surveillance: Secondary | ICD-10-CM | POA: Diagnosis not present

## 2021-07-03 DIAGNOSIS — Z9884 Bariatric surgery status: Secondary | ICD-10-CM | POA: Diagnosis not present

## 2021-07-07 DIAGNOSIS — Z9989 Dependence on other enabling machines and devices: Secondary | ICD-10-CM | POA: Diagnosis not present

## 2021-07-07 DIAGNOSIS — G4733 Obstructive sleep apnea (adult) (pediatric): Secondary | ICD-10-CM | POA: Diagnosis not present

## 2021-07-07 DIAGNOSIS — Z9884 Bariatric surgery status: Secondary | ICD-10-CM | POA: Diagnosis not present

## 2021-07-09 DIAGNOSIS — F319 Bipolar disorder, unspecified: Secondary | ICD-10-CM | POA: Diagnosis not present

## 2021-07-09 DIAGNOSIS — F1411 Cocaine abuse, in remission: Secondary | ICD-10-CM | POA: Diagnosis not present

## 2021-07-09 DIAGNOSIS — F902 Attention-deficit hyperactivity disorder, combined type: Secondary | ICD-10-CM | POA: Diagnosis not present

## 2021-08-06 DIAGNOSIS — F902 Attention-deficit hyperactivity disorder, combined type: Secondary | ICD-10-CM | POA: Diagnosis not present

## 2021-08-06 DIAGNOSIS — F319 Bipolar disorder, unspecified: Secondary | ICD-10-CM | POA: Diagnosis not present

## 2021-08-06 DIAGNOSIS — F1411 Cocaine abuse, in remission: Secondary | ICD-10-CM | POA: Diagnosis not present

## 2021-09-03 DIAGNOSIS — F319 Bipolar disorder, unspecified: Secondary | ICD-10-CM | POA: Diagnosis not present

## 2021-09-03 DIAGNOSIS — F902 Attention-deficit hyperactivity disorder, combined type: Secondary | ICD-10-CM | POA: Diagnosis not present

## 2021-09-03 DIAGNOSIS — F1411 Cocaine abuse, in remission: Secondary | ICD-10-CM | POA: Diagnosis not present

## 2021-09-09 DIAGNOSIS — Z713 Dietary counseling and surveillance: Secondary | ICD-10-CM | POA: Diagnosis not present

## 2021-11-25 DIAGNOSIS — F319 Bipolar disorder, unspecified: Secondary | ICD-10-CM | POA: Diagnosis not present

## 2021-11-25 DIAGNOSIS — F1411 Cocaine abuse, in remission: Secondary | ICD-10-CM | POA: Diagnosis not present

## 2021-11-25 DIAGNOSIS — F902 Attention-deficit hyperactivity disorder, combined type: Secondary | ICD-10-CM | POA: Diagnosis not present

## 2022-01-18 DIAGNOSIS — M25571 Pain in right ankle and joints of right foot: Secondary | ICD-10-CM | POA: Diagnosis not present

## 2022-02-16 DIAGNOSIS — F1411 Cocaine abuse, in remission: Secondary | ICD-10-CM | POA: Diagnosis not present

## 2022-02-16 DIAGNOSIS — R7309 Other abnormal glucose: Secondary | ICD-10-CM | POA: Diagnosis not present

## 2022-02-16 DIAGNOSIS — F319 Bipolar disorder, unspecified: Secondary | ICD-10-CM | POA: Diagnosis not present

## 2022-02-16 DIAGNOSIS — F902 Attention-deficit hyperactivity disorder, combined type: Secondary | ICD-10-CM | POA: Diagnosis not present

## 2022-04-13 DIAGNOSIS — F1411 Cocaine abuse, in remission: Secondary | ICD-10-CM | POA: Diagnosis not present

## 2022-04-13 DIAGNOSIS — F902 Attention-deficit hyperactivity disorder, combined type: Secondary | ICD-10-CM | POA: Diagnosis not present

## 2022-04-13 DIAGNOSIS — F319 Bipolar disorder, unspecified: Secondary | ICD-10-CM | POA: Diagnosis not present

## 2022-06-29 DIAGNOSIS — F319 Bipolar disorder, unspecified: Secondary | ICD-10-CM | POA: Diagnosis not present

## 2022-06-29 DIAGNOSIS — E559 Vitamin D deficiency, unspecified: Secondary | ICD-10-CM | POA: Diagnosis not present

## 2022-06-29 DIAGNOSIS — F902 Attention-deficit hyperactivity disorder, combined type: Secondary | ICD-10-CM | POA: Diagnosis not present

## 2022-06-29 DIAGNOSIS — Z79899 Other long term (current) drug therapy: Secondary | ICD-10-CM | POA: Diagnosis not present

## 2022-07-27 DIAGNOSIS — F1010 Alcohol abuse, uncomplicated: Secondary | ICD-10-CM | POA: Diagnosis not present

## 2022-07-27 DIAGNOSIS — F319 Bipolar disorder, unspecified: Secondary | ICD-10-CM | POA: Diagnosis not present

## 2022-07-27 DIAGNOSIS — F1411 Cocaine abuse, in remission: Secondary | ICD-10-CM | POA: Diagnosis not present

## 2022-07-27 DIAGNOSIS — F902 Attention-deficit hyperactivity disorder, combined type: Secondary | ICD-10-CM | POA: Diagnosis not present

## 2022-08-25 DIAGNOSIS — F319 Bipolar disorder, unspecified: Secondary | ICD-10-CM | POA: Diagnosis not present

## 2022-08-25 DIAGNOSIS — F1010 Alcohol abuse, uncomplicated: Secondary | ICD-10-CM | POA: Diagnosis not present

## 2022-08-25 DIAGNOSIS — F1411 Cocaine abuse, in remission: Secondary | ICD-10-CM | POA: Diagnosis not present

## 2022-08-25 DIAGNOSIS — F902 Attention-deficit hyperactivity disorder, combined type: Secondary | ICD-10-CM | POA: Diagnosis not present

## 2022-09-22 DIAGNOSIS — Z79899 Other long term (current) drug therapy: Secondary | ICD-10-CM | POA: Diagnosis not present

## 2022-09-22 DIAGNOSIS — R6889 Other general symptoms and signs: Secondary | ICD-10-CM | POA: Diagnosis not present

## 2022-09-22 DIAGNOSIS — F319 Bipolar disorder, unspecified: Secondary | ICD-10-CM | POA: Diagnosis not present

## 2022-09-22 DIAGNOSIS — F902 Attention-deficit hyperactivity disorder, combined type: Secondary | ICD-10-CM | POA: Diagnosis not present

## 2022-10-01 ENCOUNTER — Encounter: Payer: Self-pay | Admitting: Nurse Practitioner

## 2022-10-07 ENCOUNTER — Ambulatory Visit: Payer: BC Managed Care – PPO | Admitting: Nurse Practitioner

## 2022-10-07 NOTE — Progress Notes (Deleted)
   Grace Russell October 01, 1990 [id number]   History:  32 y.o. G0 presents for annual exam. Amenorrheic/Mirena IUD 03/2019. Normal pap history. Received Gardasil series. Bipolar disorder managed by PCP and therapist. Planning on having bariatric surgery.   Gynecologic History No LMP recorded. (Menstrual status: IUD).   Contraception/Family planning: IUD Sexually active: Yes  Health Maintenance Last Pap: 03/26/2021. Results were: Normal Last mammogram: Not indicated Last colonoscopy: Not indicated Last Dexa: Not indicated  Past medical history, past surgical history, family history and social history were all reviewed and documented in the EPIC chart. Works at [employer].   ROS:  A ROS was performed and pertinent positives and negatives are included.  Exam:  There were no vitals filed for this visit.  There is no height or weight on file to calculate BMI.  General appearance:  Normal Thyroid:  Symmetrical, normal in size, without palpable masses or nodularity. Respiratory  Auscultation:  Clear without wheezing or rhonchi Cardiovascular  Auscultation:  Regular rate, without rubs, murmurs or gallops  Edema/varicosities:  Not grossly evident Abdominal  Soft,nontender, without masses, guarding or rebound.  Liver/spleen:  No organomegaly noted  Hernia:  None appreciated  Skin  Inspection:  Grossly normal Breasts: Examined lying and sitting.   Right: Without masses, retractions, nipple discharge or axillary adenopathy.   Left: Without masses, retractions, nipple discharge or axillary adenopathy. Genitourinary   Inguinal/mons:  Normal without inguinal adenopathy  External genitalia:  Normal appearing vulva with no masses, tenderness, or lesions  BUS/Urethra/Skene's glands:  Normal  Vagina:  Normal appearing with normal color and discharge, no lesions  Cervix:  Normal appearing without discharge or lesions  Uterus:  Difficult to palpate due to body habitus but no gross masses or  tenderness  Adnexa/parametria:     Rt: Normal in size, without masses or tenderness.   Lt: Normal in size, without masses or tenderness.  Anus and perineum: Normal  Patient informed chaperone available to be present for breast and pelvic exam. Patient has requested no chaperone to be present. Patient has been advised what will be completed during breast and pelvic exam.   Assessment/Plan:  32 y.o. G0 for annual exam.   Well female exam with routine gynecological exam - Education provided on SBEs, importance of preventative screenings, current guidelines, high calcium diet, regular exercise, and multivitamin daily.    Encounter for routine checking of intrauterine contraceptive device (IUD) - Mirena inserted 03/2019.   Screening for cervical cancer - Normal Pap history.  Will repeat at 3-year interval per guidelines.   Return in 1 year for annual.     Grace Mackie DNP, 10:49 AM 10/07/2022

## 2022-12-23 ENCOUNTER — Ambulatory Visit: Payer: BC Managed Care – PPO | Admitting: Nurse Practitioner

## 2023-03-08 ENCOUNTER — Ambulatory Visit: Payer: BC Managed Care – PPO | Admitting: Nurse Practitioner

## 2023-03-08 NOTE — Progress Notes (Deleted)
Grace Russell 02-16-1991 [id number]   History:  32 y.o. G0 presents for annual exam. Last seen 03/2021. Amenorrheic/Mirena IUD 03/2019. Normal pap history. Received Gardasil series. Bipolar disorder managed by PCP and therapist. 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   Gynecologic History No LMP recorded. (Menstrual status: IUD).   Contraception/Family planning: IUD Sexually active: Yes  Health Maintenance Last Pap: 03/26/2021. Results were: Normal Last mammogram: Not indicated Last colonoscopy: Not indicated Last Dexa: Not indicated  Past medical history, past surgical history, family history and social history were all reviewed and documented in the EPIC chart. Works at [employer].   ROS:  A ROS was performed and pertinent positives and negatives are included.  Exam:  There were no vitals filed for this visit.  There is no height or weight on file to calculate BMI.  General appearance:  Normal Thyroid:  Symmetrical, normal in size, without palpable masses or nodularity. Respiratory  Auscultation:  Clear without wheezing or rhonchi Cardiovascular  Auscultation:  Regular rate, without rubs, murmurs or gallops  Edema/varicosities:  Not grossly evident Abdominal  Soft,nontender, without masses, guarding or rebound.  Liver/spleen:  No organomegaly noted  Hernia:  None appreciated  Skin  Inspection:  Grossly normal Breasts: Examined lying and sitting.   Right: Without masses, retractions, nipple discharge or axillary adenopathy.   Left: Without masses, retractions, nipple discharge or axillary adenopathy. Genitourinary   Inguinal/mons:  Normal without inguinal adenopathy  External genitalia:  Normal appearing vulva with no masses, tenderness, or lesions  BUS/Urethra/Skene's glands:  Normal  Vagina:  Normal appearing with normal color and discharge, no lesions  Cervix:  Normal appearing without discharge or lesions  Uterus:  Difficult to palpate due to body habitus but no  gross masses or tenderness  Adnexa/parametria:     Rt: Normal in size, without masses or tenderness.   Lt: Normal in size, without masses or tenderness.  Anus and perineum: Normal  Patient informed chaperone available to be present for breast and pelvic exam. Patient has requested no chaperone to be present. Patient has been advised what will be completed during breast and pelvic exam.   Assessment/Plan:  32 y.o. G0 for annual exam.   Well female exam with routine gynecological exam - Education provided on SBEs, importance of preventative screenings, current guidelines, high calcium diet, regular exercise, and multivitamin daily.    Encounter for routine checking of intrauterine contraceptive device (IUD) - Mirena inserted 03/2019.   Screening for cervical cancer - Normal Pap history.  Will repeat at 3-year interval per guidelines.   Return in 1 year for annual.     [name] DNP, 12:27 PM 03/08/2023

## 2023-04-19 DIAGNOSIS — Z202 Contact with and (suspected) exposure to infections with a predominantly sexual mode of transmission: Secondary | ICD-10-CM | POA: Diagnosis not present

## 2023-04-30 DIAGNOSIS — Z0189 Encounter for other specified special examinations: Secondary | ICD-10-CM | POA: Diagnosis not present

## 2023-04-30 DIAGNOSIS — F1020 Alcohol dependence, uncomplicated: Secondary | ICD-10-CM | POA: Diagnosis not present

## 2023-05-01 DIAGNOSIS — F1020 Alcohol dependence, uncomplicated: Secondary | ICD-10-CM | POA: Diagnosis not present

## 2023-05-02 DIAGNOSIS — F1020 Alcohol dependence, uncomplicated: Secondary | ICD-10-CM | POA: Diagnosis not present

## 2023-05-03 DIAGNOSIS — F1020 Alcohol dependence, uncomplicated: Secondary | ICD-10-CM | POA: Diagnosis not present

## 2023-05-04 DIAGNOSIS — F1420 Cocaine dependence, uncomplicated: Secondary | ICD-10-CM | POA: Diagnosis not present

## 2023-05-04 DIAGNOSIS — F1020 Alcohol dependence, uncomplicated: Secondary | ICD-10-CM | POA: Diagnosis not present

## 2023-05-04 DIAGNOSIS — F411 Generalized anxiety disorder: Secondary | ICD-10-CM | POA: Diagnosis not present

## 2023-05-04 DIAGNOSIS — F331 Major depressive disorder, recurrent, moderate: Secondary | ICD-10-CM | POA: Diagnosis not present

## 2023-05-05 DIAGNOSIS — F1020 Alcohol dependence, uncomplicated: Secondary | ICD-10-CM | POA: Diagnosis not present

## 2023-05-06 DIAGNOSIS — F1020 Alcohol dependence, uncomplicated: Secondary | ICD-10-CM | POA: Diagnosis not present

## 2023-05-06 DIAGNOSIS — F331 Major depressive disorder, recurrent, moderate: Secondary | ICD-10-CM | POA: Diagnosis not present

## 2023-05-06 DIAGNOSIS — Z113 Encounter for screening for infections with a predominantly sexual mode of transmission: Secondary | ICD-10-CM | POA: Diagnosis not present

## 2023-05-06 DIAGNOSIS — F411 Generalized anxiety disorder: Secondary | ICD-10-CM | POA: Diagnosis not present

## 2023-05-06 DIAGNOSIS — F1420 Cocaine dependence, uncomplicated: Secondary | ICD-10-CM | POA: Diagnosis not present

## 2023-05-13 DIAGNOSIS — F1020 Alcohol dependence, uncomplicated: Secondary | ICD-10-CM | POA: Diagnosis not present

## 2023-05-14 DIAGNOSIS — F1020 Alcohol dependence, uncomplicated: Secondary | ICD-10-CM | POA: Diagnosis not present

## 2023-05-15 DIAGNOSIS — F1020 Alcohol dependence, uncomplicated: Secondary | ICD-10-CM | POA: Diagnosis not present

## 2023-05-16 DIAGNOSIS — F1020 Alcohol dependence, uncomplicated: Secondary | ICD-10-CM | POA: Diagnosis not present

## 2023-05-17 DIAGNOSIS — F1020 Alcohol dependence, uncomplicated: Secondary | ICD-10-CM | POA: Diagnosis not present

## 2023-05-18 DIAGNOSIS — F1420 Cocaine dependence, uncomplicated: Secondary | ICD-10-CM | POA: Diagnosis not present

## 2023-05-18 DIAGNOSIS — F331 Major depressive disorder, recurrent, moderate: Secondary | ICD-10-CM | POA: Diagnosis not present

## 2023-05-18 DIAGNOSIS — F411 Generalized anxiety disorder: Secondary | ICD-10-CM | POA: Diagnosis not present

## 2023-05-18 DIAGNOSIS — F1020 Alcohol dependence, uncomplicated: Secondary | ICD-10-CM | POA: Diagnosis not present

## 2023-05-19 DIAGNOSIS — F1020 Alcohol dependence, uncomplicated: Secondary | ICD-10-CM | POA: Diagnosis not present

## 2023-05-20 DIAGNOSIS — F1020 Alcohol dependence, uncomplicated: Secondary | ICD-10-CM | POA: Diagnosis not present

## 2023-05-21 DIAGNOSIS — F1020 Alcohol dependence, uncomplicated: Secondary | ICD-10-CM | POA: Diagnosis not present

## 2023-05-22 DIAGNOSIS — F1020 Alcohol dependence, uncomplicated: Secondary | ICD-10-CM | POA: Diagnosis not present

## 2023-05-23 DIAGNOSIS — F1020 Alcohol dependence, uncomplicated: Secondary | ICD-10-CM | POA: Diagnosis not present

## 2023-05-24 DIAGNOSIS — F1020 Alcohol dependence, uncomplicated: Secondary | ICD-10-CM | POA: Diagnosis not present

## 2023-05-25 DIAGNOSIS — F1020 Alcohol dependence, uncomplicated: Secondary | ICD-10-CM | POA: Diagnosis not present

## 2023-05-25 DIAGNOSIS — F411 Generalized anxiety disorder: Secondary | ICD-10-CM | POA: Diagnosis not present

## 2023-05-25 DIAGNOSIS — F1420 Cocaine dependence, uncomplicated: Secondary | ICD-10-CM | POA: Diagnosis not present

## 2023-05-25 DIAGNOSIS — F331 Major depressive disorder, recurrent, moderate: Secondary | ICD-10-CM | POA: Diagnosis not present

## 2023-05-26 DIAGNOSIS — F1020 Alcohol dependence, uncomplicated: Secondary | ICD-10-CM | POA: Diagnosis not present

## 2023-05-27 DIAGNOSIS — F1020 Alcohol dependence, uncomplicated: Secondary | ICD-10-CM | POA: Diagnosis not present

## 2023-05-28 DIAGNOSIS — F1020 Alcohol dependence, uncomplicated: Secondary | ICD-10-CM | POA: Diagnosis not present

## 2023-05-29 DIAGNOSIS — F1020 Alcohol dependence, uncomplicated: Secondary | ICD-10-CM | POA: Diagnosis not present

## 2023-05-30 DIAGNOSIS — F1020 Alcohol dependence, uncomplicated: Secondary | ICD-10-CM | POA: Diagnosis not present

## 2023-05-31 DIAGNOSIS — F1020 Alcohol dependence, uncomplicated: Secondary | ICD-10-CM | POA: Diagnosis not present

## 2023-06-01 DIAGNOSIS — F411 Generalized anxiety disorder: Secondary | ICD-10-CM | POA: Diagnosis not present

## 2023-06-01 DIAGNOSIS — F1420 Cocaine dependence, uncomplicated: Secondary | ICD-10-CM | POA: Diagnosis not present

## 2023-06-01 DIAGNOSIS — F331 Major depressive disorder, recurrent, moderate: Secondary | ICD-10-CM | POA: Diagnosis not present

## 2023-06-01 DIAGNOSIS — F1020 Alcohol dependence, uncomplicated: Secondary | ICD-10-CM | POA: Diagnosis not present

## 2023-06-02 DIAGNOSIS — F1020 Alcohol dependence, uncomplicated: Secondary | ICD-10-CM | POA: Diagnosis not present

## 2023-06-03 DIAGNOSIS — F1020 Alcohol dependence, uncomplicated: Secondary | ICD-10-CM | POA: Diagnosis not present

## 2023-06-04 DIAGNOSIS — F1020 Alcohol dependence, uncomplicated: Secondary | ICD-10-CM | POA: Diagnosis not present

## 2023-06-07 DIAGNOSIS — F1020 Alcohol dependence, uncomplicated: Secondary | ICD-10-CM | POA: Diagnosis not present

## 2023-06-08 DIAGNOSIS — F1020 Alcohol dependence, uncomplicated: Secondary | ICD-10-CM | POA: Diagnosis not present

## 2023-06-09 DIAGNOSIS — F1020 Alcohol dependence, uncomplicated: Secondary | ICD-10-CM | POA: Diagnosis not present

## 2023-06-10 DIAGNOSIS — F1420 Cocaine dependence, uncomplicated: Secondary | ICD-10-CM | POA: Diagnosis not present

## 2023-06-10 DIAGNOSIS — F1020 Alcohol dependence, uncomplicated: Secondary | ICD-10-CM | POA: Diagnosis not present

## 2023-06-10 DIAGNOSIS — F411 Generalized anxiety disorder: Secondary | ICD-10-CM | POA: Diagnosis not present

## 2023-06-10 DIAGNOSIS — F331 Major depressive disorder, recurrent, moderate: Secondary | ICD-10-CM | POA: Diagnosis not present

## 2023-06-11 DIAGNOSIS — F1020 Alcohol dependence, uncomplicated: Secondary | ICD-10-CM | POA: Diagnosis not present

## 2023-06-14 DIAGNOSIS — F1020 Alcohol dependence, uncomplicated: Secondary | ICD-10-CM | POA: Diagnosis not present

## 2023-06-17 DIAGNOSIS — F1020 Alcohol dependence, uncomplicated: Secondary | ICD-10-CM | POA: Diagnosis not present

## 2023-06-18 DIAGNOSIS — F1020 Alcohol dependence, uncomplicated: Secondary | ICD-10-CM | POA: Diagnosis not present

## 2023-06-21 ENCOUNTER — Ambulatory Visit: Payer: BC Managed Care – PPO | Admitting: Nurse Practitioner

## 2023-06-21 DIAGNOSIS — F1020 Alcohol dependence, uncomplicated: Secondary | ICD-10-CM | POA: Diagnosis not present

## 2023-06-22 DIAGNOSIS — F1020 Alcohol dependence, uncomplicated: Secondary | ICD-10-CM | POA: Diagnosis not present

## 2023-06-24 DIAGNOSIS — J101 Influenza due to other identified influenza virus with other respiratory manifestations: Secondary | ICD-10-CM | POA: Diagnosis not present

## 2023-06-28 DIAGNOSIS — F1020 Alcohol dependence, uncomplicated: Secondary | ICD-10-CM | POA: Diagnosis not present

## 2023-06-30 DIAGNOSIS — F1020 Alcohol dependence, uncomplicated: Secondary | ICD-10-CM | POA: Diagnosis not present

## 2023-06-30 DIAGNOSIS — F411 Generalized anxiety disorder: Secondary | ICD-10-CM | POA: Diagnosis not present

## 2023-06-30 DIAGNOSIS — F331 Major depressive disorder, recurrent, moderate: Secondary | ICD-10-CM | POA: Diagnosis not present

## 2023-06-30 DIAGNOSIS — F1420 Cocaine dependence, uncomplicated: Secondary | ICD-10-CM | POA: Diagnosis not present

## 2023-07-01 DIAGNOSIS — F1020 Alcohol dependence, uncomplicated: Secondary | ICD-10-CM | POA: Diagnosis not present

## 2023-07-05 DIAGNOSIS — F1020 Alcohol dependence, uncomplicated: Secondary | ICD-10-CM | POA: Diagnosis not present

## 2023-07-07 DIAGNOSIS — F331 Major depressive disorder, recurrent, moderate: Secondary | ICD-10-CM | POA: Diagnosis not present

## 2023-07-07 DIAGNOSIS — F1420 Cocaine dependence, uncomplicated: Secondary | ICD-10-CM | POA: Diagnosis not present

## 2023-07-07 DIAGNOSIS — F1020 Alcohol dependence, uncomplicated: Secondary | ICD-10-CM | POA: Diagnosis not present

## 2023-07-07 DIAGNOSIS — F411 Generalized anxiety disorder: Secondary | ICD-10-CM | POA: Diagnosis not present

## 2023-07-08 DIAGNOSIS — F1020 Alcohol dependence, uncomplicated: Secondary | ICD-10-CM | POA: Diagnosis not present

## 2023-07-12 DIAGNOSIS — R300 Dysuria: Secondary | ICD-10-CM | POA: Diagnosis not present

## 2023-07-15 DIAGNOSIS — F1020 Alcohol dependence, uncomplicated: Secondary | ICD-10-CM | POA: Diagnosis not present

## 2023-07-15 DIAGNOSIS — F1420 Cocaine dependence, uncomplicated: Secondary | ICD-10-CM | POA: Diagnosis not present

## 2023-07-15 DIAGNOSIS — F411 Generalized anxiety disorder: Secondary | ICD-10-CM | POA: Diagnosis not present

## 2023-07-15 DIAGNOSIS — F331 Major depressive disorder, recurrent, moderate: Secondary | ICD-10-CM | POA: Diagnosis not present

## 2023-07-19 DIAGNOSIS — F1020 Alcohol dependence, uncomplicated: Secondary | ICD-10-CM | POA: Diagnosis not present

## 2023-07-21 DIAGNOSIS — F1420 Cocaine dependence, uncomplicated: Secondary | ICD-10-CM | POA: Diagnosis not present

## 2023-07-21 DIAGNOSIS — F331 Major depressive disorder, recurrent, moderate: Secondary | ICD-10-CM | POA: Diagnosis not present

## 2023-07-21 DIAGNOSIS — F411 Generalized anxiety disorder: Secondary | ICD-10-CM | POA: Diagnosis not present

## 2023-07-21 DIAGNOSIS — F1020 Alcohol dependence, uncomplicated: Secondary | ICD-10-CM | POA: Diagnosis not present

## 2023-07-22 DIAGNOSIS — F1020 Alcohol dependence, uncomplicated: Secondary | ICD-10-CM | POA: Diagnosis not present

## 2023-07-26 DIAGNOSIS — F1020 Alcohol dependence, uncomplicated: Secondary | ICD-10-CM | POA: Diagnosis not present

## 2023-07-28 DIAGNOSIS — F331 Major depressive disorder, recurrent, moderate: Secondary | ICD-10-CM | POA: Diagnosis not present

## 2023-07-28 DIAGNOSIS — F1020 Alcohol dependence, uncomplicated: Secondary | ICD-10-CM | POA: Diagnosis not present

## 2023-07-28 DIAGNOSIS — F411 Generalized anxiety disorder: Secondary | ICD-10-CM | POA: Diagnosis not present

## 2023-07-28 DIAGNOSIS — F1420 Cocaine dependence, uncomplicated: Secondary | ICD-10-CM | POA: Diagnosis not present

## 2023-07-29 DIAGNOSIS — F1020 Alcohol dependence, uncomplicated: Secondary | ICD-10-CM | POA: Diagnosis not present

## 2023-08-02 DIAGNOSIS — F1020 Alcohol dependence, uncomplicated: Secondary | ICD-10-CM | POA: Diagnosis not present

## 2023-08-04 DIAGNOSIS — F1020 Alcohol dependence, uncomplicated: Secondary | ICD-10-CM | POA: Diagnosis not present

## 2023-08-04 DIAGNOSIS — F411 Generalized anxiety disorder: Secondary | ICD-10-CM | POA: Diagnosis not present

## 2023-08-04 DIAGNOSIS — F331 Major depressive disorder, recurrent, moderate: Secondary | ICD-10-CM | POA: Diagnosis not present

## 2023-08-04 DIAGNOSIS — F1420 Cocaine dependence, uncomplicated: Secondary | ICD-10-CM | POA: Diagnosis not present

## 2023-08-30 DIAGNOSIS — F1020 Alcohol dependence, uncomplicated: Secondary | ICD-10-CM | POA: Diagnosis not present

## 2023-09-13 DIAGNOSIS — F1020 Alcohol dependence, uncomplicated: Secondary | ICD-10-CM | POA: Diagnosis not present

## 2023-09-27 DIAGNOSIS — F1020 Alcohol dependence, uncomplicated: Secondary | ICD-10-CM | POA: Diagnosis not present

## 2023-10-05 DIAGNOSIS — H04123 Dry eye syndrome of bilateral lacrimal glands: Secondary | ICD-10-CM | POA: Diagnosis not present

## 2023-10-05 DIAGNOSIS — H52221 Regular astigmatism, right eye: Secondary | ICD-10-CM | POA: Diagnosis not present

## 2023-10-05 DIAGNOSIS — H5213 Myopia, bilateral: Secondary | ICD-10-CM | POA: Diagnosis not present

## 2023-10-11 DIAGNOSIS — F1020 Alcohol dependence, uncomplicated: Secondary | ICD-10-CM | POA: Diagnosis not present

## 2023-10-25 DIAGNOSIS — F1020 Alcohol dependence, uncomplicated: Secondary | ICD-10-CM | POA: Diagnosis not present

## 2023-10-26 ENCOUNTER — Emergency Department (HOSPITAL_BASED_OUTPATIENT_CLINIC_OR_DEPARTMENT_OTHER): Admitting: Radiology

## 2023-10-26 ENCOUNTER — Encounter (HOSPITAL_BASED_OUTPATIENT_CLINIC_OR_DEPARTMENT_OTHER): Payer: Self-pay | Admitting: Emergency Medicine

## 2023-10-26 ENCOUNTER — Other Ambulatory Visit: Payer: Self-pay

## 2023-10-26 ENCOUNTER — Inpatient Hospital Stay (HOSPITAL_BASED_OUTPATIENT_CLINIC_OR_DEPARTMENT_OTHER)
Admission: EM | Admit: 2023-10-26 | Discharge: 2023-10-29 | DRG: 605 | Disposition: A | Discharge status: Home / Self Care | Source: Ambulatory Visit | Attending: Internal Medicine | Admitting: Internal Medicine

## 2023-10-26 DIAGNOSIS — Z79899 Other long term (current) drug therapy: Secondary | ICD-10-CM | POA: Diagnosis not present

## 2023-10-26 DIAGNOSIS — Z8249 Family history of ischemic heart disease and other diseases of the circulatory system: Secondary | ICD-10-CM

## 2023-10-26 DIAGNOSIS — S61451A Open bite of right hand, initial encounter: Principal | ICD-10-CM

## 2023-10-26 DIAGNOSIS — E876 Hypokalemia: Secondary | ICD-10-CM | POA: Diagnosis not present

## 2023-10-26 DIAGNOSIS — L03113 Cellulitis of right upper limb: Secondary | ICD-10-CM | POA: Diagnosis not present

## 2023-10-26 DIAGNOSIS — F32A Depression, unspecified: Secondary | ICD-10-CM | POA: Diagnosis present

## 2023-10-26 DIAGNOSIS — W540XXA Bitten by dog, initial encounter: Secondary | ICD-10-CM | POA: Diagnosis not present

## 2023-10-26 DIAGNOSIS — L03119 Cellulitis of unspecified part of limb: Secondary | ICD-10-CM | POA: Diagnosis not present

## 2023-10-26 DIAGNOSIS — M79641 Pain in right hand: Secondary | ICD-10-CM | POA: Diagnosis not present

## 2023-10-26 DIAGNOSIS — Z888 Allergy status to other drugs, medicaments and biological substances status: Secondary | ICD-10-CM

## 2023-10-26 DIAGNOSIS — Z833 Family history of diabetes mellitus: Secondary | ICD-10-CM

## 2023-10-26 DIAGNOSIS — L089 Local infection of the skin and subcutaneous tissue, unspecified: Secondary | ICD-10-CM | POA: Diagnosis not present

## 2023-10-26 DIAGNOSIS — Z5986 Financial insecurity: Secondary | ICD-10-CM | POA: Diagnosis not present

## 2023-10-26 DIAGNOSIS — S61431A Puncture wound without foreign body of right hand, initial encounter: Secondary | ICD-10-CM | POA: Diagnosis not present

## 2023-10-26 DIAGNOSIS — L02519 Cutaneous abscess of unspecified hand: Secondary | ICD-10-CM | POA: Diagnosis present

## 2023-10-26 DIAGNOSIS — Z91018 Allergy to other foods: Secondary | ICD-10-CM

## 2023-10-26 DIAGNOSIS — L02511 Cutaneous abscess of right hand: Secondary | ICD-10-CM | POA: Diagnosis not present

## 2023-10-26 DIAGNOSIS — F3181 Bipolar II disorder: Secondary | ICD-10-CM | POA: Diagnosis not present

## 2023-10-26 DIAGNOSIS — M7989 Other specified soft tissue disorders: Secondary | ICD-10-CM | POA: Diagnosis not present

## 2023-10-26 DIAGNOSIS — F419 Anxiety disorder, unspecified: Secondary | ICD-10-CM | POA: Diagnosis not present

## 2023-10-26 DIAGNOSIS — Z91013 Allergy to seafood: Secondary | ICD-10-CM | POA: Diagnosis not present

## 2023-10-26 DIAGNOSIS — L0390 Cellulitis, unspecified: Secondary | ICD-10-CM | POA: Diagnosis present

## 2023-10-26 DIAGNOSIS — Z9889 Other specified postprocedural states: Secondary | ICD-10-CM | POA: Diagnosis not present

## 2023-10-26 LAB — CBC WITH DIFFERENTIAL/PLATELET
Abs Immature Granulocytes: 0.02 10*3/uL (ref 0.00–0.07)
Basophils Absolute: 0 10*3/uL (ref 0.0–0.1)
Basophils Relative: 0 %
Eosinophils Absolute: 0 10*3/uL (ref 0.0–0.5)
Eosinophils Relative: 1 %
HCT: 44.6 % (ref 36.0–46.0)
Hemoglobin: 15.2 g/dL — ABNORMAL HIGH (ref 12.0–15.0)
Immature Granulocytes: 0 %
Lymphocytes Relative: 29 %
Lymphs Abs: 2.2 10*3/uL (ref 0.7–4.0)
MCH: 30 pg (ref 26.0–34.0)
MCHC: 34.1 g/dL (ref 30.0–36.0)
MCV: 88 fL (ref 80.0–100.0)
Monocytes Absolute: 0.6 10*3/uL (ref 0.1–1.0)
Monocytes Relative: 8 %
Neutro Abs: 4.9 10*3/uL (ref 1.7–7.7)
Neutrophils Relative %: 62 %
Platelets: 305 10*3/uL (ref 150–400)
RBC: 5.07 MIL/uL (ref 3.87–5.11)
RDW: 12 % (ref 11.5–15.5)
WBC: 7.8 10*3/uL (ref 4.0–10.5)
nRBC: 0 % (ref 0.0–0.2)

## 2023-10-26 LAB — COMPREHENSIVE METABOLIC PANEL WITH GFR
ALT: 20 U/L (ref 0–44)
AST: 19 U/L (ref 15–41)
Albumin: 4.4 g/dL (ref 3.5–5.0)
Alkaline Phosphatase: 86 U/L (ref 38–126)
Anion gap: 13 (ref 5–15)
BUN: 6 mg/dL (ref 6–20)
CO2: 24 mmol/L (ref 22–32)
Calcium: 9.7 mg/dL (ref 8.9–10.3)
Chloride: 103 mmol/L (ref 98–111)
Creatinine, Ser: 0.76 mg/dL (ref 0.44–1.00)
GFR, Estimated: >60 mL/min (ref >60)
Glucose, Bld: 85 mg/dL (ref 70–99)
Potassium: 3.6 mmol/L (ref 3.5–5.1)
Sodium: 140 mmol/L (ref 135–145)
Total Bilirubin: 0.3 mg/dL (ref 0.0–1.2)
Total Protein: 7.2 g/dL (ref 6.5–8.1)

## 2023-10-26 LAB — HCG, QUANTITATIVE, PREGNANCY: hCG, Beta Chain, Quant, S: <1 m[IU]/mL (ref <5)

## 2023-10-26 MED ORDER — MORPHINE SULFATE (PF) 4 MG/ML IV SOLN
4.0000 mg | Freq: Once | INTRAVENOUS | Status: AC
  Administered 2023-10-26: 4 mg via INTRAVENOUS
  Filled 2023-10-26: qty 1

## 2023-10-26 MED ORDER — VANCOMYCIN HCL IN DEXTROSE 1-5 GM/200ML-% IV SOLN
1000.0000 mg | Freq: Once | INTRAVENOUS | Status: AC
  Administered 2023-10-26: 1000 mg via INTRAVENOUS
  Filled 2023-10-26: qty 200

## 2023-10-26 MED ORDER — SODIUM CHLORIDE 0.9 % IV SOLN
3.0000 g | Freq: Once | INTRAVENOUS | Status: AC
  Administered 2023-10-26: 3 g via INTRAVENOUS

## 2023-10-26 NOTE — ED Triage Notes (Signed)
 Dog bite right hand yesterday. Personal dog, UTD on vaccines Seen at [facility]. Xray showed " gas"  sent for possible IV abt Tetanus given at [facility]

## 2023-10-26 NOTE — Progress Notes (Signed)
 ED Pharmacy Antibiotic Sign Off An antibiotic consult was received from an ED provider for vancomycin per pharmacy dosing for wound infection secondary to a dog bite. A chart review was completed to assess appropriateness.   The following one time order(s) were placed:  Vancomycin 2000mg x 1  Unasyn 3g per provider   Further antibiotic and/or antibiotic pharmacy consults should be ordered by the admitting provider if indicated.   Thank you for allowing pharmacy to be a part of this patient's care.   Carolle Ishii  Elvan Hamel, PharmD, BCCCP  Clinical Pharmacist 10/26/23 8:49 PM

## 2023-10-26 NOTE — ED Provider Notes (Signed)
 [place] EMERGENCY DEPARTMENT AT [facility] Provider Note   CSN: [id number] Arrival date & time: 10/26/23  1645     History Chief Complaint  Patient presents with   Animal Bite    Grace Russell is a 33 y.o. female with history of anxiety depression on Cymbalta  presents the emerged from today for evaluation of a dog bite to her right hand yesterday around 1400.  Patient reports that the medium dog around 40 pounds and is her own dog.  She reports that both of her dogs were in a fight and she was trying to break it out.  Both of her dogs are up-to-date on their rabies shots.  She was unsure of her tetanus however was given a [facility] previous to arrival.  She reports that there was "gas" seen on an x-ray at [facility] and sent her over here.  She reports that around an hour after the incident happened she cleaned it with water and isopropyl alcohol.  Per patient, there was purulence expressed from the wound with urgent care.  She denies any fevers or numbness or tingling.  She is right-hand dominant.  She denies any immunosuppressant medications.  She has no known drug allergies.  Denies any tobacco, EtOH illicit drug use.   Animal Bite Associated symptoms: no fever        Home Medications Prior to Admission medications   Medication Sig Start Date End Date Taking? Authorizing Provider  ARIPiprazole (ABILIFY) 5 MG tablet Take by mouth. 11/11/20   [provider]  Armodafinil 150 MG tablet Take 150 mg by mouth daily. Patient not taking: Reported on 03/26/2021    [provider]  buPROPion (WELLBUTRIN SR) 150 MG 12 hr tablet  01/07/21   [provider]  buPROPion (WELLBUTRIN) 75 MG tablet Take 75 mg by mouth daily. 02/26/21   [provider]  diclofenac  Sodium (VOLTAREN ) 1 % GEL Apply 2 g topically 4 (four) times daily. Patient not taking: Reported on 03/26/2021 02/28/21   Khatri, Hina, PA-C  fluconazole  (DIFLUCAN ) 150 MG tablet Take 1 tablet (150 mg  total) by mouth every 3 (three) days. Patient not taking: Reported on 03/26/2021 08/04/19   Mallory Seaman, MD  lamoTRIgine (LAMICTAL) 100 MG tablet Take 100 mg by mouth daily.    [provider]  lidocaine  (LIDODERM ) 5 % Place 1 patch onto the skin daily. Remove & Discard patch within 12 hours or as directed by MD Patient not taking: Reported on 03/26/2021 02/28/21   Khatri, Hina, PA-C  methocarbamol  (ROBAXIN ) 500 MG tablet Take 1 tablet (500 mg total) by mouth 2 (two) times daily. Patient not taking: Reported on 03/26/2021 02/28/21   Khatri, Hina, PA-C  naproxen  (NAPROSYN ) 500 MG tablet Take 1 tablet (500 mg total) by mouth 2 (two) times daily. Patient not taking: Reported on 03/26/2021 02/28/21   Khatri, Hina, PA-C  omeprazole (PRILOSEC) 40 MG capsule Take by mouth. 05/28/21   [provider]  ondansetron (ZOFRAN) 8 MG tablet Take by mouth.    [provider]      Allergies    Mushroom extract complex (obsolete), Other, Shellfish allergy, and Prozac [fluoxetine hcl]    Review of Systems   Review of Systems  Constitutional:  Negative for chills and fever.  Skin:  Positive for color change and wound.    Physical Exam Updated Vital Signs BP 120/84   Pulse 64   Temp 97.8 F (36.6 C)   Resp 18   SpO2  100%  Physical Exam Vitals and nursing note reviewed.  Constitutional:      General: She is not in acute distress.    Appearance: She is not ill-appearing or toxic-appearing.  Eyes:     General: No scleral icterus. Pulmonary:     Effort: Pulmonary effort is normal. No respiratory distress.  Musculoskeletal:        General: Swelling present.     Comments: See images attached to the chart.  Patient has diffuse erythema, increase in warmth, and swelling to the dorsum of the right hand.  There is a small laceration seen overlying the wrist.  She has brisk cap refill to the fingers.  Fingers are little cooler to touch however it is symmetric bilaterally.  Patient  reports this is at her baseline and is a known issue.  Palpable radial pulse.  Compartments are soft sensation reportedly intact and symmetric bilaterally.  She is able to flex and extend the fingers however is not able to fully make a fist due to the swelling and pain.  Thumb AB and adduction still intact.  She does have a few bruises noted throughout the arm however do not see any other puncture wounds.  Tattoos present.  Skin:    General: Skin is warm and dry.  Neurological:     Mental Status: She is alert.            ED Results / Procedures / Treatments   Labs (all labs ordered are listed, but only abnormal results are displayed) Labs Reviewed  CBC WITH DIFFERENTIAL/PLATELET - Abnormal; Notable for the following components:      Result Value   Hemoglobin 15.2 (*)    All other components within normal limits  COMPREHENSIVE METABOLIC PANEL WITH GFR  HCG, QUANTITATIVE, PREGNANCY    EKG None  Radiology DG Hand Complete Right Addendum Date: 10/26/2023 ADDENDUM REPORT: 10/26/2023 22:18 ADDENDUM: A voice recognition error occurred. The last sentence should read: No radiopaque foreign body is noted. Electronically Signed   By: Violeta Grey M.D.   On: 10/26/2023 22:18   Result Date: 10/26/2023 CLINICAL DATA:  Dog bite yesterday with persistent pain, initial encounter EXAM: RIGHT HAND - COMPLETE 3+ VIEW COMPARISON:  None Available. FINDINGS: Soft tissue swelling is noted along the dorsum of the hand. No definitive fracture or dislocation is seen. Radiopaque foreign body is noted. IMPRESSION: Soft tissue swelling consistent with the recent injury. No bony abnormality noted. Electronically Signed: By: Violeta Grey M.D. On: 10/26/2023 20:56    Procedures Procedures   Medications Ordered in ED Medications  vancomycin  (VANCOCIN ) IVPB 1000 mg/200 mL premix (0 mg Intravenous Stopped 10/26/23 2250)    Followed by  vancomycin  (VANCOCIN ) IVPB 1000 mg/200 mL premix (1,000 mg Intravenous New  Bag/Given 10/26/23 2248)  morphine  (PF) 4 MG/ML injection 4 mg (4 mg Intravenous Given 10/26/23 2058)  Ampicillin -Sulbactam (UNASYN ) 3 g in sodium chloride  0.9 % 100 mL IVPB (0 g Intravenous Stopped 10/26/23 2139)  morphine  (PF) 4 MG/ML injection 4 mg (4 mg Intravenous Given 10/26/23 2316)    ED Course/ Medical Decision Making/ A&P Clinical Course as of 10/26/23 2319  Tue Oct 26, 2023  2027 Erythema of the hand was outlined. [RR]  2038 Spoke with Heather , RPH. She reports that Unasyn  and Vanc is appropriate coverage. Will continue.  [RR]  2123 Spoke with Dr. Del Favia who will be admitting the patient.  [RR]    Clinical Course User Index [RR] Spence Dux, PA-C  Medical Decision Making Amount and/or Complexity of Data Reviewed Labs: ordered. Radiology: ordered.  Risk Prescription drug management. Decision regarding hospitalization.   33 y.o. female presents to the ER for evaluation of dog bite with hand swelling and redness. Differential diagnosis includes but is not limited to cellulitis, deep space infection. Vital signs unremarkable. Physical exam as noted above.   Patient was sent over from [facility] due to the appearance of the hand. The patient brought in the image read that does not mention any gas.   At 2027, I have outlined the erythema of the patient's hand.   I independently reviewed and interpreted the patient's labs.  CMP shows no electrolyte or LFT abnormality.  CBC shows slightly increased hemoglobin 15.2.  No leukocytosis.  hCG negative.  Due to the erythema and swelling, I have consulted hand surgery.  I spoke with Dr. Marce Sensing who reviewed the images I have placed into the chart.  He agrees with the antibiotics of Unasyn  and vancomycin  and would like the patient admitted to [facility] under the medicine care team.  He would like the patient n.p.o. after midnight and he will see them in the morning.  Recommends obtaining a new x-ray given  that we cannot see the x-ray from [facility].  No CT for now.  Called pharmacist, Heather , about antibiotic choices and she agrees with the orders.   XR right soft tissue swelling consistent with the recent injury. No bony abnormality noted. Per radiologist's interpretation.    I have alerted Dr. Marce Sensing on the x-ray results.  Still hold off for CT for now.  Patient awaiting transfer for bed at [facility] emergency department.  Pain managed with morphine .  Patient is aware of the plan and agrees for admission.  Admit to Dr. Del Favia.  Portions of this report may have been transcribed using voice recognition software. Every effort was made to ensure accuracy; however, inadvertent computerized transcription errors may be present.   Final Clinical Impression(s) / ED Diagnoses Final diagnoses:  Dog bite of right hand, initial encounter  Cellulitis of hand    Rx / DC Orders ED Discharge Orders     None         Spence Dux, PA-C 10/26/23 2327    Sallyanne Creamer, DO 10/29/23 0040

## 2023-10-27 ENCOUNTER — Encounter (HOSPITAL_COMMUNITY): Payer: Self-pay | Admitting: Internal Medicine

## 2023-10-27 DIAGNOSIS — S61451A Open bite of right hand, initial encounter: Secondary | ICD-10-CM

## 2023-10-27 DIAGNOSIS — W540XXA Bitten by dog, initial encounter: Secondary | ICD-10-CM | POA: Diagnosis not present

## 2023-10-27 DIAGNOSIS — F3181 Bipolar II disorder: Secondary | ICD-10-CM | POA: Diagnosis not present

## 2023-10-27 DIAGNOSIS — Z833 Family history of diabetes mellitus: Secondary | ICD-10-CM | POA: Diagnosis not present

## 2023-10-27 DIAGNOSIS — Z79899 Other long term (current) drug therapy: Secondary | ICD-10-CM | POA: Diagnosis not present

## 2023-10-27 DIAGNOSIS — L03119 Cellulitis of unspecified part of limb: Secondary | ICD-10-CM | POA: Insufficient documentation

## 2023-10-27 DIAGNOSIS — L0390 Cellulitis, unspecified: Secondary | ICD-10-CM | POA: Diagnosis present

## 2023-10-27 DIAGNOSIS — Z9889 Other specified postprocedural states: Secondary | ICD-10-CM | POA: Diagnosis not present

## 2023-10-27 DIAGNOSIS — Z91013 Allergy to seafood: Secondary | ICD-10-CM | POA: Diagnosis not present

## 2023-10-27 DIAGNOSIS — L03113 Cellulitis of right upper limb: Secondary | ICD-10-CM | POA: Diagnosis not present

## 2023-10-27 DIAGNOSIS — F419 Anxiety disorder, unspecified: Secondary | ICD-10-CM | POA: Diagnosis not present

## 2023-10-27 DIAGNOSIS — Z888 Allergy status to other drugs, medicaments and biological substances status: Secondary | ICD-10-CM | POA: Diagnosis not present

## 2023-10-27 DIAGNOSIS — F32A Depression, unspecified: Secondary | ICD-10-CM

## 2023-10-27 DIAGNOSIS — Z91018 Allergy to other foods: Secondary | ICD-10-CM | POA: Diagnosis not present

## 2023-10-27 DIAGNOSIS — Z5986 Financial insecurity: Secondary | ICD-10-CM | POA: Diagnosis not present

## 2023-10-27 DIAGNOSIS — S61431A Puncture wound without foreign body of right hand, initial encounter: Secondary | ICD-10-CM | POA: Diagnosis not present

## 2023-10-27 DIAGNOSIS — Z8249 Family history of ischemic heart disease and other diseases of the circulatory system: Secondary | ICD-10-CM | POA: Diagnosis not present

## 2023-10-27 DIAGNOSIS — L02511 Cutaneous abscess of right hand: Secondary | ICD-10-CM | POA: Diagnosis not present

## 2023-10-27 DIAGNOSIS — E876 Hypokalemia: Secondary | ICD-10-CM | POA: Diagnosis not present

## 2023-10-27 LAB — BASIC METABOLIC PANEL WITH GFR
Anion gap: 7 (ref 5–15)
Anion gap: 7 (ref 5–15)
BUN: 5 mg/dL — ABNORMAL LOW (ref 6–20)
BUN: <5 mg/dL — ABNORMAL LOW (ref 6–20)
CO2: 19 mmol/L — ABNORMAL LOW (ref 22–32)
CO2: 26 mmol/L (ref 22–32)
Calcium: 6.9 mg/dL — ABNORMAL LOW (ref 8.9–10.3)
Calcium: 9 mg/dL (ref 8.9–10.3)
Chloride: 115 mmol/L — ABNORMAL HIGH (ref 98–111)
Chloride: 106 mmol/L (ref 98–111)
Creatinine, Ser: 0.73 mg/dL (ref 0.44–1.00)
Creatinine, Ser: 0.91 mg/dL (ref 0.44–1.00)
GFR, Estimated: >60 mL/min (ref >60)
GFR, Estimated: >60 mL/min (ref >60)
Glucose, Bld: 77 mg/dL (ref 70–99)
Glucose, Bld: 126 mg/dL — ABNORMAL HIGH (ref 70–99)
Potassium: 2.7 mmol/L — CL (ref 3.5–5.1)
Potassium: 3.3 mmol/L — ABNORMAL LOW (ref 3.5–5.1)
Sodium: 141 mmol/L (ref 135–145)
Sodium: 139 mmol/L (ref 135–145)

## 2023-10-27 LAB — CBC
HCT: 30.5 % — ABNORMAL LOW (ref 36.0–46.0)
Hemoglobin: 10.3 g/dL — ABNORMAL LOW (ref 12.0–15.0)
MCH: 30 pg (ref 26.0–34.0)
MCHC: 33.8 g/dL (ref 30.0–36.0)
MCV: 88.9 fL (ref 80.0–100.0)
Platelets: 206 10*3/uL (ref 150–400)
RBC: 3.43 MIL/uL — ABNORMAL LOW (ref 3.87–5.11)
RDW: 11.9 % (ref 11.5–15.5)
WBC: 6.2 10*3/uL (ref 4.0–10.5)
nRBC: 0 % (ref 0.0–0.2)

## 2023-10-27 LAB — HIV ANTIBODY (ROUTINE TESTING W REFLEX): HIV Screen 4th Generation wRfx: NONREACTIVE

## 2023-10-27 LAB — MAGNESIUM: Magnesium: 2.3 mg/dL (ref 1.7–2.4)

## 2023-10-27 MED ORDER — SODIUM CHLORIDE 0.9 % IV SOLN
3.0000 g | Freq: Four times a day (QID) | INTRAVENOUS | Status: DC
  Administered 2023-10-27 – 2023-10-29 (×9): 3 g via INTRAVENOUS
  Filled 2023-10-27 (×9): qty 8

## 2023-10-27 MED ORDER — ACETAMINOPHEN 650 MG RE SUPP: 650.0000 mg | Freq: Four times a day (QID) | RECTAL | Status: DC | PRN

## 2023-10-27 MED ORDER — ACETAMINOPHEN 325 MG PO TABS
650.0000 mg | ORAL_TABLET | Freq: Four times a day (QID) | ORAL | Status: DC | PRN
  Administered 2023-10-27: 650 mg via ORAL
  Filled 2023-10-27: qty 2

## 2023-10-27 MED ORDER — MORPHINE SULFATE (PF) 2 MG/ML IV SOLN
1.0000 mg | INTRAVENOUS | Status: DC | PRN
  Administered 2023-10-27 – 2023-10-28 (×3): 1 mg via INTRAVENOUS
  Filled 2023-10-27 (×3): qty 1

## 2023-10-27 MED ORDER — POTASSIUM CHLORIDE CRYS ER 20 MEQ PO TBCR
20.0000 meq | EXTENDED_RELEASE_TABLET | Freq: Two times a day (BID) | ORAL | Status: AC
  Administered 2023-10-27 – 2023-10-28 (×4): 20 meq via ORAL
  Filled 2023-10-27 (×4): qty 1

## 2023-10-27 NOTE — Plan of Care (Signed)
 Assumed care at 1900. Pt has been resting comfortably in bed overnight. Pt has expressed complaints of pain, see MAR. Pt is Aox4, ambulatory and oriented tot he room. Pt has been NPO since midnight.    Problem: Education: Goal: Knowledge of General Education information will improve Description: Including pain rating scale, medication(s)/side effects and non-pharmacologic comfort measures Outcome: Not Applicable   Problem: Health Behavior/Discharge Planning: Goal: Ability to manage health-related needs will improve Outcome: Not Applicable   Problem: Clinical Measurements: Goal: Ability to maintain clinical measurements within normal limits will improve Outcome: Not Applicable Goal: Will remain free from infection Outcome: Not Applicable Goal: Diagnostic test results will improve Outcome: Not Applicable Goal: Respiratory complications will improve Outcome: Not Applicable Goal: Cardiovascular complication will be avoided Outcome: Not Applicable   Problem: Activity: Goal: Risk for activity intolerance will decrease Outcome: Not Applicable   Problem: Nutrition: Goal: Adequate nutrition will be maintained Outcome: Not Applicable   Problem: Coping: Goal: Level of anxiety will decrease Outcome: Not Applicable   Problem: Elimination: Goal: Will not experience complications related to bowel motility Outcome: Not Applicable Goal: Will not experience complications related to urinary retention Outcome: Not Applicable   Problem: Pain Managment: Goal: General experience of comfort will improve and/or be controlled Outcome: Not Applicable   Problem: Safety: Goal: Ability to remain free from injury will improve Outcome: Not Applicable   Problem: Skin Integrity: Goal: Risk for impaired skin integrity will decrease Outcome: Not Applicable

## 2023-10-27 NOTE — Consult Note (Signed)
 HAND SURGERY CONSULTATION  REQUESTING PHYSICIAN: Vada Garibaldi, MD   Chief Complaint: Right hand swelling over the dorsal aspect after dog bite  HPI: Grace Russell is a 33 y.o. female who presents with swelling over the dorsal aspect of the right hand after dog bite sustained 2 days prior.  It was her dog, who is up-to-date on all vaccines.  She presented to the emergency department yesterday, 1 day after injury with notable swelling and redness with associated warmth throughout the dorsal aspect of the right hand, was admitted overnight to the medical service for IV antibiotics.  She describes improvement in her pain and swelling throughout the right hand overnight with elevation and IV antibiotics.  Is able to perform more range of motion of the digits today.  Denies any numbness or tingling, no other sites of pain or injury.  Hand dominance: Right  Past Medical History:  Diagnosis Date   Anxiety    Bipolar 2 disorder (HCC)    Depression    Past Surgical History:  Procedure Laterality Date   INTRAUTERINE DEVICE INSERTION  04/06/2019   LAPAROSCOPIC GASTRIC SLEEVE RESECTION  05/2021   WISDOM TOOTH EXTRACTION     Social History   Socioeconomic History   Marital status: Single    Spouse name: Not on file   Number of children: Not on file   Years of education: Not on file   Highest education level: Not on file  Occupational History   Not on file  Tobacco Use   Smoking status: Never   Smokeless tobacco: Never  Vaping Use   Vaping status: Never Used  Substance and Sexual Activity   Alcohol use: Yes    Comment: 5-6 drinks a week   Drug use: No   Sexual activity: Yes    Partners: Male    Birth control/protection: I.U.D.    Comment: Mirena  (04/06/2019); First IC <16 y/o, >5 Partners  Other Topics Concern   Not on file  Social History Narrative   Not on file   Social Drivers of Health   Financial Resource Strain: Medium Risk (06/04/2021)   Received from [facility]   Overall Financial Resource Strain (CARDIA)    Difficulty of Paying Living Expenses: Somewhat hard  Food Insecurity: No Food Insecurity (10/27/2023)   Hunger Vital Sign    Worried About Running Out of Food in the Last Year: Never true    Ran Out of Food in the Last Year: Never true  Transportation Needs: No Transportation Needs (10/27/2023)   PRAPARE - Administrator, Civil Service (Medical): No    Lack of Transportation (Non-Medical): No  Physical Activity: Insufficiently Active (02/03/2021)   Received from [facility], [facility]   Exercise Vital Sign    Days of Exercise per Week: 2 days    Minutes of Exercise per Session: 30 min  Stress: No Stress Concern Present (06/04/2021)   Received from [facility] - Occupational Stress Questionnaire    Feeling of Stress : Only a little  Social Connections: Unknown (10/29/2021)   Received from [facility], [facility]   Social Network    Social Network: Not on file   Family History  Problem Relation Age of Onset   Heart disease Father    Diabetes Father    Heart disease Paternal Grandfather    - negative except otherwise stated in the family history section Allergies  Allergen  Reactions   Mushroom Extract Complex (Obsolete)    Other     Dog dander   Shellfish Allergy Hives    Crab only    Prozac [Fluoxetine Hcl] Anxiety   Prior to Admission medications   Medication Sig Start Date End Date Taking? Authorizing Provider  DULoxetine (CYMBALTA) 60 MG capsule Take 60 mg by mouth daily. AM   Yes [provider]  ARIPiprazole (ABILIFY) 5 MG tablet Take by mouth. Patient not taking: Reported on 10/27/2023 11/11/20   [provider]  Armodafinil 150 MG tablet Take 150 mg by mouth daily. Patient not taking: Reported on 03/26/2021    [provider]  buPROPion (WELLBUTRIN SR) 150 MG 12 hr tablet  01/07/21   [provider]  buPROPion (WELLBUTRIN) 75 MG tablet Take 75 mg by mouth daily. Patient not taking: Reported on 10/27/2023 02/26/21   [provider]  diclofenac  Sodium (VOLTAREN ) 1 % GEL Apply 2 g topically 4 (four) times daily. Patient not taking: Reported on 03/26/2021 02/28/21   Khatri, Hina, PA-C  fluconazole  (DIFLUCAN ) 150 MG tablet Take 1 tablet (150 mg total) by mouth every 3 (three) days. Patient not taking: Reported on 03/26/2021 08/04/19   Mallory Seaman, MD  lamoTRIgine (LAMICTAL) 100 MG tablet Take 100 mg by mouth daily. Patient not taking: Reported on 10/27/2023    [provider]  lidocaine  (LIDODERM ) 5 % Place 1 patch onto the skin daily. Remove & Discard patch within 12 hours or as directed by MD Patient not taking: Reported on 03/26/2021 02/28/21   Khatri, Hina, PA-C  methocarbamol  (ROBAXIN ) 500 MG tablet Take 1 tablet (500 mg total) by mouth 2 (two) times daily. Patient not taking: Reported on 03/26/2021 02/28/21   Khatri, Hina, PA-C  naproxen  (NAPROSYN ) 500 MG tablet Take 1 tablet (500 mg total) by mouth 2 (two) times daily. Patient not taking: Reported on 03/26/2021 02/28/21   Khatri, Hina, PA-C  omeprazole (PRILOSEC) 40 MG capsule Take by mouth. Patient not taking: Reported on 10/27/2023 05/28/21   [provider]  ondansetron (ZOFRAN) 8 MG tablet Take by mouth. Patient not taking: Reported on 10/27/2023    [provider]   DG Hand Complete Right Addendum Date: 10/26/2023 ADDENDUM REPORT: 10/26/2023 22:18 ADDENDUM: A voice recognition error occurred. The last sentence should read: No radiopaque foreign body is noted. Electronically Signed   By: Violeta Grey M.D.   On: 10/26/2023 22:18   Result Date: 10/26/2023 CLINICAL DATA:  Dog bite yesterday with persistent pain, initial encounter EXAM: RIGHT HAND - COMPLETE 3+ VIEW COMPARISON:  None Available. FINDINGS: Soft tissue swelling is noted along the dorsum of the hand. No definitive fracture or dislocation is  seen. Radiopaque foreign body is noted. IMPRESSION: Soft tissue swelling consistent with the recent injury. No bony abnormality noted. Electronically Signed: By: Violeta Grey M.D. On: 10/26/2023 20:56   - Positive ROS: All other systems have been reviewed and were otherwise negative with the exception of those mentioned in the HPI and as above.  Physical Exam: General: No acute distress, resting comfortably Cardiovascular: BUE warm and well perfused, normal rate Respiratory: Normal WOB on RA Skin: Warm and dry Neurologic: Sensation intact distally Psychiatric: Patient is at baseline mood and affect  Right upper Extremity  Moderate swelling throughout the dorsal aspect of the right hand, tender to palpation, mild warmth without significant erythema, appears improved from the demarcated region from admission  Able to perform digital range of motion, extension nearly full  without significant restriction, flexion nearly full, approximately 1 cm from distal palmar crease with attempted composite fist actively, slightly improved passively without significant pain  Sensation intact median/radial/ulnar distributions, AIN/PIN/interosseous intact  No significant erythema or warmth tracking up the forearm, no streaking     Assessment: 33 year old female with right hand cellulitis after dog bite injury  Plan: Based on clinical examination today, it appears that she is improved significantly overnight with IV antibiotics.  Will lift n.p.o. restriction currently, no surgical plan from hand standpoint at this juncture.  Would recommend continue IV antibiotics for least 24 hours to ensure ongoing improvement.  Maintain elevation of the hand and encourage digital range of motion.  Will plan to reevaluate tomorrow morning to ensure ongoing improvement.  Please make n.p.o. at midnight tonight once again.  Thank you for the consult and the opportunity to see Ms. Sassi  Dalila Arca Alvia Jointer,  M.D. [facility]

## 2023-10-27 NOTE — ED Notes (Signed)
 [facility] at bedside

## 2023-10-27 NOTE — H&P (Signed)
 History and Physical    Grace Russell ZOX:[id number] DOB: 02/09/1991 DOA: 10/26/2023  Patient coming from: Home.  Chief Complaint: Right hand dog bite.  HPI: Grace Russell is a 33 y.o. female with history of depression had a dog bite yesterday.  Patient states the dog was her own dog and has been up-to-date on the vaccinations.  The dog bit on the dorsum of her right hand and had significant swelling and pain and she presented to the ER.  Denies any fever or chills.  Had some difficulty in completely flexing the right hand.  ED Course: In the ER patient was afebrile.  Labs showed WBC count of 7.8 x-rays do not show any bone involvement.  ER physician discussed with Dr. Marce Sensing on-call orthopedic surgeon for hand and started on IV Unasyn admitted for further workup.  At the time of my exam patient states the swelling and pain has improved.  Review of Systems: As per HPI, rest all negative.   Past Medical History:  Diagnosis Date   Anxiety    Bipolar 2 disorder [facility])    Depression     Past Surgical History:  Procedure Laterality Date   INTRAUTERINE DEVICE INSERTION  04/06/2019   LAPAROSCOPIC GASTRIC SLEEVE RESECTION  05/2021   WISDOM TOOTH EXTRACTION       reports that she has never smoked. She has never used smokeless tobacco. She reports current alcohol use. She reports that she does not use drugs.  Allergies  Allergen Reactions   Mushroom Extract Complex (Obsolete)    Other     Dog dander   Shellfish Allergy Hives    Crab only    Prozac [Fluoxetine Hcl] Anxiety    Family History  Problem Relation Age of Onset   Heart disease Father    Diabetes Father    Heart disease Paternal Grandfather     Prior to Admission medications   Medication Sig Start Date End Date Taking? Authorizing Provider  DULoxetine (CYMBALTA) 60 MG capsule Take 60 mg by mouth daily. AM   Yes [provider]  ARIPiprazole (ABILIFY) 5 MG tablet Take by mouth. Patient not taking: Reported  on 10/27/2023 11/11/20   [provider]  Armodafinil 150 MG tablet Take 150 mg by mouth daily. Patient not taking: Reported on 03/26/2021    [provider]  buPROPion (WELLBUTRIN SR) 150 MG 12 hr tablet  01/07/21   [provider]  buPROPion (WELLBUTRIN) 75 MG tablet Take 75 mg by mouth daily. Patient not taking: Reported on 10/27/2023 02/26/21   [provider]  diclofenac  Sodium (VOLTAREN ) 1 % GEL Apply 2 g topically 4 (four) times daily. Patient not taking: Reported on 03/26/2021 02/28/21   Khatri, Hina, PA-C  fluconazole  (DIFLUCAN ) 150 MG tablet Take 1 tablet (150 mg total) by mouth every 3 (three) days. Patient not taking: Reported on 03/26/2021 08/04/19   Mallory Seaman, MD  lamoTRIgine (LAMICTAL) 100 MG tablet Take 100 mg by mouth daily. Patient not taking: Reported on 10/27/2023    [provider]  lidocaine  (LIDODERM ) 5 % Place 1 patch onto the skin daily. Remove & Discard patch within 12 hours or as directed by MD Patient not taking: Reported on 03/26/2021 02/28/21   Khatri, Hina, PA-C  methocarbamol  (ROBAXIN ) 500 MG tablet Take 1 tablet (500 mg total) by mouth 2 (two) times daily. Patient not taking: Reported on 03/26/2021 02/28/21   Khatri, Hina, PA-C  naproxen  (NAPROSYN ) 500 MG tablet Take 1 tablet (500 mg total) by  mouth 2 (two) times daily. Patient not taking: Reported on 03/26/2021 02/28/21   Khatri, Hina, PA-C  omeprazole (PRILOSEC) 40 MG capsule Take by mouth. Patient not taking: Reported on 10/27/2023 05/28/21   [provider]  ondansetron (ZOFRAN) 8 MG tablet Take by mouth. Patient not taking: Reported on 10/27/2023    [provider]    Physical Exam: Constitutional: Moderately built and nourished. Vitals:   10/26/23 1650 10/26/23 2315 10/27/23 0039 10/27/23 0041  BP: 122/81 120/84 118/88   Pulse: 66 64 72   Resp: 17 18 20    Temp: 97.8 F (36.6 C)  97.8 F (36.6 C)   TempSrc:   Oral   SpO2: 100% 100% 100%   Weight:    97.5  kg  Height:    5\' 4"  (1.626 m)   Eyes: Anicteric no pallor. ENMT: No discharge from the ears eyes nose and mouth. Neck: No mass felt.  No neck rigidity. Respiratory: No rhonchi or crepitations. Cardiovascular: S1-S2 heard. Abdomen: Soft nontender bowel sound present. Musculoskeletal: Right hand swelling unable to completely flex the hand. Skin: Mild erythema of the dorsum of the right hand with swelling and a small puncture wound. Neurologic: Alert awake oriented to time place and person.  Moves all extremities. Psychiatric: Appears normal.  Normal affect.   Labs on Admission: I have personally reviewed following labs and imaging studies  CBC: Recent Labs  Lab 10/26/23 2002  WBC 7.8  NEUTROABS 4.9  HGB 15.2*  HCT 44.6  MCV 88.0  PLT 305   Basic Metabolic Panel: Recent Labs  Lab 10/26/23 2002  NA 140  K 3.6  CL 103  CO2 24  GLUCOSE 85  BUN 6  CREATININE 0.76  CALCIUM 9.7   GFR: Estimated Creatinine Clearance: 116.6 mL/min (by C-G formula based on SCr of 0.76 mg/dL). Liver Function Tests: Recent Labs  Lab 10/26/23 2002  AST 19  ALT 20  ALKPHOS 86  BILITOT 0.3  PROT 7.2  ALBUMIN 4.4   No results for input(s): "LIPASE", "AMYLASE" in the last 168 hours. No results for input(s): "AMMONIA" in the last 168 hours. Coagulation Profile: No results for input(s): "INR", "PROTIME" in the last 168 hours. Cardiac Enzymes: No results for input(s): "CKTOTAL", "CKMB", "CKMBINDEX", "TROPONINI" in the last 168 hours. BNP (last 3 results) No results for input(s): "PROBNP" in the last 8760 hours. HbA1C: No results for input(s): "HGBA1C" in the last 72 hours. CBG: No results for input(s): "GLUCAP" in the last 168 hours. Lipid Profile: No results for input(s): "CHOL", "HDL", "LDLCALC", "TRIG", "CHOLHDL", "LDLDIRECT" in the last 72 hours. Thyroid  Function Tests: No results for input(s): "TSH", "T4TOTAL", "FREET4", "T3FREE", "THYROIDAB" in the last 72 hours. Anemia  Panel: No results for input(s): "VITAMINB12", "FOLATE", "FERRITIN", "TIBC", "IRON", "RETICCTPCT" in the last 72 hours. Urine analysis:    Component Value Date/Time   COLORURINE YELLOW 08/04/2019 1146   APPEARANCEUR CLEAR 08/04/2019 1146   LABSPEC 1.010 08/04/2019 1146   PHURINE 6.0 08/04/2019 1146   GLUCOSEU NEGATIVE 08/04/2019 1146   HGBUR NEGATIVE 08/04/2019 1146   BILIRUBINUR NEG 01/26/2014 1559   KETONESUR NEGATIVE 08/04/2019 1146   PROTEINUR NEGATIVE 08/04/2019 1146   UROBILINOGEN 0.2 01/26/2014 1559   NITRITE NEGATIVE 08/04/2019 1146   LEUKOCYTESUR TRACE (A) 08/04/2019 1146   Sepsis Labs: @LABRCNTIP (procalcitonin:4,lacticidven:4) )No results found for this or any previous visit (from the past 240 hours).   Radiological Exams on Admission: DG Hand Complete Right Addendum Date: 10/26/2023 ADDENDUM REPORT: 10/26/2023 22:18 ADDENDUM: A  voice recognition error occurred. The last sentence should read: No radiopaque foreign body is noted. Electronically Signed   By: Violeta Grey M.D.   On: 10/26/2023 22:18   Result Date: 10/26/2023 CLINICAL DATA:  Dog bite yesterday with persistent pain, initial encounter EXAM: RIGHT HAND - COMPLETE 3+ VIEW COMPARISON:  None Available. FINDINGS: Soft tissue swelling is noted along the dorsum of the hand. No definitive fracture or dislocation is seen. Radiopaque foreign body is noted. IMPRESSION: Soft tissue swelling consistent with the recent injury. No bony abnormality noted. Electronically Signed: By: Violeta Grey M.D. On: 10/26/2023 20:56     Assessment/Plan Principal Problem:   Cellulitis of right hand Active Problems:   Depression   Dog bite   Cellulitis    Cellulitis of the right hand after dog bite -    hand surgeon Dr. Katrine Parody was consulted.  Will keep patient on IV Unasyn for now.  Per report patient's dog has been up-to-date on vaccination and patient also received Tdap.  Will keep patient n.p.o. until seen by hand surgeon. History of  depression on Cymbalta.   Since patient has right hand cellulitis after animal bite will need more than 2 midnight stay.   DVT prophylaxis: SCDs. Code Status: Full code. Family Communication: Discussed with patient. Disposition Plan: Medical floor. Consults called: Hand surgery. Admission status: Observation.

## 2023-10-27 NOTE — Progress Notes (Signed)
 33 year old with no significant medical history bitten by dog on her right dorsum of the hand, increased swelling and redness so came to the ER.  Up-to-date on vaccinations.  Medically stable in the ER.  X-ray without foreign body.  Due to spreading cellulitis on the dominant hand and animal bite, patient was admitted to the hospital with hand surgery consultation.  Patient is started on Unasyn.  Patient seen and examined.  Swelling is already improving.  She is able to move her wrist.  Pain is controlled.  WC count is normal.  Anticipate conservative management.  Elevation, continue Unasyn, hand surgery to continue to follow.   Lab error: Morning labs clinically not correlating.  Repeat labs were done and they were normal.  Same-day admit.  No charge visit.  Anticipate home tomorrow if further improvement of infection on oral antibiotics.

## 2023-10-28 DIAGNOSIS — Z9889 Other specified postprocedural states: Secondary | ICD-10-CM | POA: Diagnosis not present

## 2023-10-28 DIAGNOSIS — Z888 Allergy status to other drugs, medicaments and biological substances status: Secondary | ICD-10-CM | POA: Diagnosis not present

## 2023-10-28 DIAGNOSIS — Z5986 Financial insecurity: Secondary | ICD-10-CM | POA: Diagnosis not present

## 2023-10-28 DIAGNOSIS — L03119 Cellulitis of unspecified part of limb: Secondary | ICD-10-CM | POA: Diagnosis present

## 2023-10-28 DIAGNOSIS — E876 Hypokalemia: Secondary | ICD-10-CM | POA: Diagnosis present

## 2023-10-28 DIAGNOSIS — S61431A Puncture wound without foreign body of right hand, initial encounter: Secondary | ICD-10-CM | POA: Diagnosis present

## 2023-10-28 DIAGNOSIS — S61451A Open bite of right hand, initial encounter: Secondary | ICD-10-CM | POA: Diagnosis present

## 2023-10-28 DIAGNOSIS — L03113 Cellulitis of right upper limb: Secondary | ICD-10-CM | POA: Diagnosis present

## 2023-10-28 DIAGNOSIS — F3181 Bipolar II disorder: Secondary | ICD-10-CM | POA: Diagnosis present

## 2023-10-28 DIAGNOSIS — L02511 Cutaneous abscess of right hand: Secondary | ICD-10-CM | POA: Diagnosis present

## 2023-10-28 DIAGNOSIS — W540XXA Bitten by dog, initial encounter: Secondary | ICD-10-CM | POA: Diagnosis not present

## 2023-10-28 DIAGNOSIS — Z79899 Other long term (current) drug therapy: Secondary | ICD-10-CM | POA: Diagnosis not present

## 2023-10-28 DIAGNOSIS — F419 Anxiety disorder, unspecified: Secondary | ICD-10-CM | POA: Diagnosis present

## 2023-10-28 DIAGNOSIS — Z833 Family history of diabetes mellitus: Secondary | ICD-10-CM | POA: Diagnosis not present

## 2023-10-28 DIAGNOSIS — Z91018 Allergy to other foods: Secondary | ICD-10-CM | POA: Diagnosis not present

## 2023-10-28 DIAGNOSIS — L02519 Cutaneous abscess of unspecified hand: Secondary | ICD-10-CM | POA: Diagnosis present

## 2023-10-28 DIAGNOSIS — Z8249 Family history of ischemic heart disease and other diseases of the circulatory system: Secondary | ICD-10-CM | POA: Diagnosis not present

## 2023-10-28 DIAGNOSIS — Z91013 Allergy to seafood: Secondary | ICD-10-CM | POA: Diagnosis not present

## 2023-10-28 MED ORDER — DULOXETINE HCL 60 MG PO CPEP
60.0000 mg | ORAL_CAPSULE | Freq: Every day | ORAL | Status: DC
  Administered 2023-10-28 – 2023-10-29 (×2): 60 mg via ORAL
  Filled 2023-10-28 (×2): qty 1

## 2023-10-28 MED ORDER — OXYCODONE HCL 5 MG PO TABS
5.0000 mg | ORAL_TABLET | ORAL | Status: DC | PRN
  Administered 2023-10-28 (×2): 5 mg via ORAL
  Filled 2023-10-28 (×2): qty 1

## 2023-10-28 NOTE — Progress Notes (Signed)
 PROGRESS NOTE    Grace Russell  NGE:[id number] DOB: 29-Jan-1991 DOA: 10/26/2023 PCP: Ronna Coho, MD    Brief Narrative:  33 year old with no significant medical history, history of depression on Cymbalta bit by her own dog dorsum of the right hand 1 day prior to arrival, significant swelling and pain so came to the ER.  In the emergency room hemodynamically stable.  Significant infection of the hand, admitted with IV antibiotics and hand surgery evaluation.  Subjective: Seen and examined.  No overnight events.  Pain is better today but is still using occasional morphine.  Start doing therapies. Assessment & Plan:   Spreading cellulitis of the right hand due to dog bite: Up-to-date on immunization. Currently on Unasyn and clinically improving.  Followed by hand surgery.  Anticipating conservative management.  Elevate.  Start doing hand and wrist exercises with occupational therapy.  Mobilize in the hallway.  Likely home tomorrow on oral antibiotics.  History of depression: On Cymbalta.  Continue.  Hypokalemia: Replaced.  Magnesium is adequate.   DVT prophylaxis: SCDs Start: 10/27/23 0246   Code Status: Full code Family Communication: None at the bedside Disposition Plan: Status is: Observation The patient will require care spanning > 2 midnights and should be moved to inpatient because: IV antibiotics     Consultants:  Hand surgery  Procedures:  None  Antimicrobials:  Unasyn 5/6---     Objective: Vitals:   10/27/23 2004 10/27/23 2333 10/28/23 0440 10/28/23 0746  BP: 109/76 112/68 (!) 99/55 111/71  Pulse: 93 67 69 61  Resp: 20 19 20 17   Temp: 98.6 F (37 C) 98.4 F (36.9 C) 98.3 F (36.8 C) 98.4 F (36.9 C)  TempSrc: Oral Oral Oral Oral  SpO2: 99% 98% 99% 96%  Weight:      Height:       No intake or output data in the 24 hours ending 10/28/23 1017 Filed Weights   10/27/23 0041  Weight: 97.5 kg    Examination:  General exam: Appears calm and  comfortable  Respiratory system: Clear to auscultation. Respiratory effort normal. Cardiovascular system: S1 & S2 heard, RRR.  Gastrointestinal system: Soft.  Nontender bowel sound present. Central nervous system: Alert and oriented. No focal neurological deficits. Extremities: Symmetric 5 x 5 power. Skin:  Multiple tattoos in the body. Right hand with erythematous swelling mostly on the dorsum of the hand, 1 cm bite mark with thin purulent drainage.  Receding margins.    Data Reviewed: I have personally reviewed following labs and imaging studies  CBC: Recent Labs  Lab 10/26/23 2002 10/27/23 0552  WBC 7.8 6.2  NEUTROABS 4.9  --   HGB 15.2* 10.3*  HCT 44.6 30.5*  MCV 88.0 88.9  PLT 305 206   Basic Metabolic Panel: Recent Labs  Lab 10/26/23 2002 10/27/23 0552 10/27/23 0834  NA 140 141 139  K 3.6 2.7* 3.3*  CL 103 115* 106  CO2 24 19* 26  GLUCOSE 85 77 126*  BUN 6 5* <5*  CREATININE 0.76 0.73 0.91  CALCIUM 9.7 6.9* 9.0  MG  --   --  2.3   GFR: Estimated Creatinine Clearance: 102.5 mL/min (by C-G formula based on SCr of 0.91 mg/dL). Liver Function Tests: Recent Labs  Lab 10/26/23 2002  AST 19  ALT 20  ALKPHOS 86  BILITOT 0.3  PROT 7.2  ALBUMIN 4.4   No results for input(s): "LIPASE", "AMYLASE" in the last 168 hours. No results for input(s): "AMMONIA" in the last 168 hours.  Coagulation Profile: No results for input(s): "INR", "PROTIME" in the last 168 hours. Cardiac Enzymes: No results for input(s): "CKTOTAL", "CKMB", "CKMBINDEX", "TROPONINI" in the last 168 hours. BNP (last 3 results) No results for input(s): "PROBNP" in the last 8760 hours. HbA1C: No results for input(s): "HGBA1C" in the last 72 hours. CBG: No results for input(s): "GLUCAP" in the last 168 hours. Lipid Profile: No results for input(s): "CHOL", "HDL", "LDLCALC", "TRIG", "CHOLHDL", "LDLDIRECT" in the last 72 hours. Thyroid  Function Tests: No results for input(s): "TSH", "T4TOTAL",  "FREET4", "T3FREE", "THYROIDAB" in the last 72 hours. Anemia Panel: No results for input(s): "VITAMINB12", "FOLATE", "FERRITIN", "TIBC", "IRON", "RETICCTPCT" in the last 72 hours. Sepsis Labs: No results for input(s): "PROCALCITON", "LATICACIDVEN" in the last 168 hours.  No results found for this or any previous visit (from the past 240 hours).       Radiology Studies: DG Hand Complete Right Addendum Date: 10/26/2023 ADDENDUM REPORT: 10/26/2023 22:18 ADDENDUM: A voice recognition error occurred. The last sentence should read: No radiopaque foreign body is noted. Electronically Signed   By: Violeta Grey M.D.   On: 10/26/2023 22:18   Result Date: 10/26/2023 CLINICAL DATA:  Dog bite yesterday with persistent pain, initial encounter EXAM: RIGHT HAND - COMPLETE 3+ VIEW COMPARISON:  None Available. FINDINGS: Soft tissue swelling is noted along the dorsum of the hand. No definitive fracture or dislocation is seen. Radiopaque foreign body is noted. IMPRESSION: Soft tissue swelling consistent with the recent injury. No bony abnormality noted. Electronically Signed: By: Violeta Grey M.D. On: 10/26/2023 20:56        Scheduled Meds:  DULoxetine  60 mg Oral Daily   potassium chloride  20 mEq Oral BID   Continuous Infusions:  ampicillin-sulbactam (UNASYN) IV 3 g (10/28/23 0611)     LOS: 0 days    Time spent: 35 minutes    Vada Garibaldi, MD [facility]

## 2023-10-28 NOTE — Progress Notes (Signed)
   Grace Russell - 33 y.o. female MRN [id number]  Date of birth: 1990-10-19    Subjective:  Hand swelling and pain continues to improve, erythema improving.  Did have some mild drainage from the wound site overnight.  Objective:   VITALS:   Vitals:   10/27/23 2004 10/27/23 2333 10/28/23 0440 10/28/23 0746  BP: 109/76 112/68 (!) 99/55 111/71  Pulse: 93 67 69 61  Resp: 20 19 20 17   Temp: 98.6 F (37 C) 98.4 F (36.9 C) 98.3 F (36.8 C) 98.4 F (36.9 C)  TempSrc: Oral Oral Oral Oral  SpO2: 99% 98% 99% 96%  Weight:      Height:        Gen: Awake and alert Pulm: Breathing comfortably on room air CV: Palpable pulses, regular rate  Right upper extremity  Transverse bite mark over the dorsal aspect of the hand, measures 0.5 cm, mild drainage expressible from this region today  Moderate swelling throughout the dorsal aspect of the right hand continues to improve, tender to palpation, minimal warmth without significant erythema, continues to appears improved from the demarcated region from admission   Able to perform digital range of motion, extension nearly full without significant restriction, flexion nearly full, composite fist actively, slightly improved passively without significant pain   Sensation intact median/radial/ulnar distributions, AIN/PIN/interosseous intact   No significant erythema or warmth tracking up the forearm, no streaking    Lab Results  Component Value Date   WBC 6.2 10/27/2023   HGB 10.3 (L) 10/27/2023   HCT 30.5 (L) 10/27/2023   MCV 88.9 10/27/2023   PLT 206 10/27/2023     Assessment/Plan:     33 year old female with right hand dog bite injury with subsequent cellulitis, continues to improve with IV antibiotics  Based on clinical examination today, she continues to improve with IV antibiotics.    Would recommend continue IV antibiotics for least 24 hours additional to ensure ongoing improvement.   Maintain elevation of the hand and  encourage digital range of motion.   Will reevaluate once again tomorrow morning in the a.m. to ensure ongoing improvement prior to transition oral antibiotics.  Discussed with patient today, she expressed full understanding.    Jesselle Laflamme, MD [place], [facility] 10/28/2023, 8:06 AM

## 2023-10-28 NOTE — Plan of Care (Signed)
 Assumed care at 1900. Pt has been ambulating independently in the room. Some complaints of pain overnight, see MAR. Right hand is elevated on pillows. Scant purulent drainage was noticed overnight. No other events overnight.

## 2023-10-28 NOTE — Evaluation (Signed)
 Occupational Therapy Evaluation Patient Details Name: Grace Russell MRN: [id number] DOB: 1990/06/24 Today's Date: 10/28/2023   History of Present Illness   33 year old female with right hand dog bite injury dorsum of the hand with subsequent cellulitis. No PMH     Clinical Impressions PTA pt lives independently with her boyfriend. Completed education regarding edema control, ROM, tendon gliding and grip/pinch strengthening. Provided handouts for HEP. Pt able to make full grip and pinch, which she states has significantly improved. All questions answered and pt verbalized understanding of HEP. Do not feel pt will need OT follow up for hand therapy at this time. OT signing off.      If plan is discharge home, recommend the following:   Assistance with cooking/housework     Functional Status Assessment   Patient has not had a recent decline in their functional status     Equipment Recommendations   None recommended by OT     Recommendations for Other Services         Precautions/Restrictions   Precautions Precautions: None Restrictions Weight Bearing Restrictions Per Provider Order: No     Mobility Bed Mobility Overal bed mobility: Independent                  Transfers Overall transfer level: Independent                        Balance                                           ADL either performed or assessed with clinical judgement   ADL Overall ADL's : At baseline                                       General ADL Comments: issued red tubing for utensils if needed     Vision         Perception         Praxis         Pertinent Vitals/Pain Pain Assessment Pain Assessment: 0-10 Pain Score: 5  Pain Location: R hand Pain Descriptors / Indicators: Aching, Discomfort     Extremity/Trunk Assessment Upper Extremity Assessment Upper Extremity Assessment: RUE deficits/detail RUE Deficits /  Details: Moderate edema dorsum of hand. borders less than original demarcation line. Able to make full fist to touch tips to palm. RUE Coordination: decreased gross motor           Communication     Cognition Arousal: Alert Behavior During Therapy: WFL for tasks assessed/performed Cognition: No apparent impairments                                       Cueing  General Comments          Exercises Exercises: Other exercises Other Exercises Other Exercises: R tendon goiding exercises - hand Other Exercises: Full A/AAROM MP adn IPs all digits - able to touch tips of digits to palm actively Other Exercises: squeeze foam - red grip and pinch Other Exercises: yellow T-putty grip and pinch strengthening   Shoulder Instructions      Home Living Family/patient expects to be discharged to:: Private residence Living Arrangements:  Spouse/significant other Available Help at Discharge: Family;Available PRN/intermittently Type of Home: House Home Access: Stairs to enter [employer] of Steps: 2   Home Layout: One level     Bathroom Shower/Tub: Chief Strategy Officer: Standard Bathroom Accessibility: No   Home Equipment: None          Prior Functioning/Environment Prior Level of Function : Independent/Modified Independent;Driving (" [employer]")                    OT Problem List: Decreased range of motion;Impaired UE functional use;Pain   OT Treatment/Interventions:        OT Goals(Current goals can be found in the care plan section)   Acute Rehab OT Goals Patient Stated Goal: home OT Goal Formulation: All assessment and education complete, DC therapy   OT Frequency:       Co-evaluation              AM-PAC OT "6 Clicks" Daily Activity     Outcome Measure Help from another person eating meals?: None Help from another person taking care of personal grooming?: None Help from another person toileting, which  includes using toliet, bedpan, or urinal?: None Help from another person bathing (including washing, rinsing, drying)?: None Help from another person to put on and taking off regular upper body clothing?: None Help from another person to put on and taking off regular lower body clothing?: None 6 Click Score: 24   End of Session Nurse Communication: Patient requests pain meds  Activity Tolerance: Patient tolerated treatment well Patient left: in bed;with call bell/phone within reach  OT Visit Diagnosis: Muscle weakness (generalized) (M62.81);Pain Pain - Right/Left: Right Pain - part of body: Hand                Time: 1308-6578 OT Time Calculation (min): 21 min Charges:  OT General Charges $OT Visit: 1 Visit OT Evaluation $OT Eval Low Complexity: 1 Low  Junice Fei, OT/L   Acute OT Clinical Specialist Acute Rehabilitation Services Pager [phone] Office [phone]   [facility] 10/28/2023, 9:13 AM

## 2023-10-28 NOTE — Plan of Care (Signed)
   Problem: Education: Goal: Knowledge of General Education information will improve Description Including pain rating scale, medication(s)/side effects and non-pharmacologic comfort measures Outcome: Progressing   Problem: Health Behavior/Discharge Planning: Goal: Ability to manage health-related needs will improve Outcome: Progressing

## 2023-10-29 DIAGNOSIS — L03119 Cellulitis of unspecified part of limb: Secondary | ICD-10-CM | POA: Diagnosis not present

## 2023-10-29 MED ORDER — OXYCODONE HCL 5 MG PO TABS: 5.0000 mg | ORAL_TABLET | Freq: Four times a day (QID) | ORAL | 0 refills | Status: AC | PRN

## 2023-10-29 MED ORDER — AMOXICILLIN-POT CLAVULANATE 875-125 MG PO TABS: 1.0000 | ORAL_TABLET | Freq: Two times a day (BID) | ORAL | 0 refills | Status: AC

## 2023-10-29 MED ORDER — ACETAMINOPHEN 500 MG PO TABS: 1000.0000 mg | ORAL_TABLET | Freq: Four times a day (QID) | ORAL | Status: DC | PRN

## 2023-10-29 NOTE — Progress Notes (Signed)
   Grace Russell - 33 y.o. female MRN [id number]  Date of birth: 03-20-1991    Subjective:  Hand swelling and pain continues to improve, minimal pain, ROM improves. Minimal drainage.   Objective:   VITALS:   Vitals:   10/28/23 1230 10/28/23 1601 10/28/23 2029 10/29/23 0800  BP: 121/67 127/81 127/83 112/77  Pulse: 93 96 81 61  Resp: 18 18 18 16   Temp: 98.4 F (36.9 C) (!) 97.5 F (36.4 C) 98 F (36.7 C) (!) 97.5 F (36.4 C)  TempSrc: Oral Oral Oral Oral  SpO2: 100% 99% 99% 99%  Weight:      Height:        Gen: Awake and alert Pulm: Breathing comfortably on room air CV: Palpable pulses, regular rate  Right upper extremity  Transverse bite mark over the dorsal aspect of the hand, measures 0.5 cm, minimal drainage expressible from this region today  Mild swelling throughout the dorsal aspect of the right hand continues to improve, tender to palpation, minimal warmth without significant erythema, continues to appears improved from the demarcated region from admission   Able to perform digital range of motion, extension nearly full without significant restriction, flexion nearly full, composite fist actively, slightly improved passively without significant pain   Sensation intact median/radial/ulnar distributions, AIN/PIN/interosseous intact   No significant erythema or warmth tracking up the forearm, no streaking    Lab Results  Component Value Date   WBC 6.2 10/27/2023   HGB 10.3 (L) 10/27/2023   HCT 30.5 (L) 10/27/2023   MCV 88.9 10/27/2023   PLT 206 10/27/2023     Assessment/Plan:     33 year old female with right hand dog bite injury with subsequent cellulitis, continues to improve with conservative management   Based on clinical examination today, she continues to improve with IV antibiotics.    Transition to oral antibiotics for discharge at this point  Maintain elevation of the hand and encourage digital range of motion.   Discussed with medical  team.  Will plan to see patient in the office next week for wound check.  Strict return precautions were discussed with the patient today as noted.    [name], MD , [facility] 10/29/2023, 9:26 AM

## 2023-10-29 NOTE — Discharge Summary (Signed)
 Physician Discharge Summary  Grace Russell ZOX:[id number] DOB: [birth year] DOA: 10/26/2023  PCP: Ronna Coho, MD  Admit date: 10/26/2023 Discharge date: 10/29/2023  Admitted From: Home Disposition: Home  Rec patient ommendations for Outpatient Follow-up:  Follow up with PCP in 1-2 weeks And surgery to schedule follow-up  Home Health: N/A Equipment/Devices: N/A  Discharge Condition: Stable CODE STATUS: Full code Diet recommendation: Regular diet  Discharge summary:  33 year old with history of depression on Cymbalta , bitten by her dog who is up-to-date on immunization on her right dorsum of the hand 1 day prior to admit.  Severe pain and swelling with spreading cellulitis.  She was admitted to hospital.  Followed by hand surgery.  Treated with Unasyn  for 3 days with improvement of symptoms.  Currently anticipating conservative management due to rapid improvement.  As per surgical recommendation, ongoing home with Augmentin for 7 days, a short course of pain medication was prescribed.  Elevation of the part.  Follow-up with surgery.  Discharge Diagnoses:  Principal Problem:   Cellulitis of hand Active Problems:   Depression   Dog bite   Cellulitis   Dog bite of right hand   Cellulitis and abscess of hand    Discharge Instructions  Discharge Instructions     Diet - low sodium heart healthy   Complete by: As directed    Increase activity slowly   Complete by: As directed    No wound care   Complete by: As directed       Allergies as of 10/29/2023       Reactions   Mushroom Extract Complex (obsolete)    Unknown rxn.   Other    Dog dander   Shellfish Allergy Hives   Crab only   Fluoxetine Anxiety   Prozac [fluoxetine Hcl] Anxiety        Medication List     TAKE these medications    acetaminophen  500 MG tablet Commonly known as: TYLENOL  Take 2 tablets (1,000 mg total) by mouth every 6 (six) hours as needed for mild pain (pain score 1-3), headache or fever (or  Fever >/= 101).   amoxicillin-clavulanate 875-125 MG tablet Commonly known as: AUGMENTIN Take 1 tablet by mouth 2 (two) times daily for 7 days.   DULoxetine  60 MG capsule Commonly known as: CYMBALTA  Take 60 mg by mouth daily. AM   oxyCODONE  5 MG immediate release tablet Commonly known as: Oxy IR/ROXICODONE  Take 1 tablet (5 mg total) by mouth every 6 (six) hours as needed for up to 3 days for severe pain (pain score 7-10) or moderate pain (pain score 4-6).        Follow-up Information     Merrill Abide, MD Follow up on 11/02/2023.   Specialty: Orthopedic Surgery Why: 1:30pm, For wound re-check Contact information: [address] [place]  [place] [address] [phone]                Allergies  Allergen Reactions   Mushroom Extract Complex (Obsolete)     Unknown rxn.   Other     Dog dander   Shellfish Allergy Hives    Crab only    Fluoxetine Anxiety   Prozac [Fluoxetine Hcl] Anxiety    Consultations: Hand surgery   Procedures/Studies: DG Hand Complete Right Addendum Date: 10/26/2023 ADDENDUM REPORT: 10/26/2023 22:18 ADDENDUM: A voice recognition error occurred. The last sentence should read: No radiopaque foreign body is noted. Electronically Signed   By: Violeta Grey M.D.   On: 10/26/2023 22:18   Result Date:  10/26/2023 CLINICAL DATA:  Dog bite yesterday with persistent pain, initial encounter EXAM: RIGHT HAND - COMPLETE 3+ VIEW COMPARISON:  None Available. FINDINGS: Soft tissue swelling is noted along the dorsum of the hand. No definitive fracture or dislocation is seen. Radiopaque foreign body is noted. IMPRESSION: Soft tissue swelling consistent with the recent injury. No bony abnormality noted. Electronically Signed: By: Violeta Grey M.D. On: 10/26/2023 20:56   (Echo, Carotid, EGD, Colonoscopy, ERCP)    Subjective: Patient seen and examined.  No overnight events.  Afebrile.  Walking around in the hallway.  Still has mild to moderate pain intermittently,  however much better than before.  Eager to go home.   Discharge Exam: Vitals:   10/28/23 2029 10/29/23 0800  BP: 127/83 112/77  Pulse: 81 61  Resp: 18 16  Temp: 98 F (36.7 C) (!) 97.5 F (36.4 C)  SpO2: 99% 99%   Vitals:   10/28/23 1230 10/28/23 1601 10/28/23 2029 10/29/23 0800  BP: 121/67 127/81 127/83 112/77  Pulse: 93 96 81 61  Resp: 18 18 18 16   Temp: 98.4 F (36.9 C) (!) 97.5 F (36.4 C) 98 F (36.7 C) (!) 97.5 F (36.4 C)  TempSrc: Oral Oral Oral Oral  SpO2: 100% 99% 99% 99%  Weight:      Height:        General: Pt is alert, awake, not in acute distress Cardiovascular: RRR, S1/S2 +, no rubs, no gallops Respiratory: CTA bilaterally, no wheezing, no rhonchi Abdominal: Soft, NT, ND, bowel sounds + Extremities: no edema, no cyanosis Mild swelling of the dorsum of the right hand with receding erythema, no fluctuation.  Range of motion of the joints are intact.    The results of significant diagnostics from this hospitalization (including imaging, microbiology, ancillary and laboratory) are listed below for reference.     Microbiology: No results found for this or any previous visit (from the past 240 hours).   Labs: BNP (last 3 results) No results for input(s): "BNP" in the last 8760 hours. Basic Metabolic Panel: Recent Labs  Lab 10/26/23 2002 10/27/23 0552 10/27/23 0834  NA 140 141 139  K 3.6 2.7* 3.3*  CL 103 115* 106  CO2 24 19* 26  GLUCOSE 85 77 126*  BUN 6 5* <5*  CREATININE 0.76 0.73 0.91  CALCIUM 9.7 6.9* 9.0  MG  --   --  2.3   Liver Function Tests: Recent Labs  Lab 10/26/23 2002  AST 19  ALT 20  ALKPHOS 86  BILITOT 0.3  PROT 7.2  ALBUMIN 4.4   No results for input(s): "LIPASE", "AMYLASE" in the last 168 hours. No results for input(s): "AMMONIA" in the last 168 hours. CBC: Recent Labs  Lab 10/26/23 2002 10/27/23 0552  WBC 7.8 6.2  NEUTROABS 4.9  --   HGB 15.2* 10.3*  HCT 44.6 30.5*  MCV 88.0 88.9  PLT 305 206   Cardiac  Enzymes: No results for input(s): "CKTOTAL", "CKMB", "CKMBINDEX", "TROPONINI" in the last 168 hours. BNP: Invalid input(s): "POCBNP" CBG: No results for input(s): "GLUCAP" in the last 168 hours. D-Dimer No results for input(s): "DDIMER" in the last 72 hours. Hgb A1c No results for input(s): "HGBA1C" in the last 72 hours. Lipid Profile No results for input(s): "CHOL", "HDL", "LDLCALC", "TRIG", "CHOLHDL", "LDLDIRECT" in the last 72 hours. Thyroid  function studies No results for input(s): "TSH", "T4TOTAL", "T3FREE", "THYROIDAB" in the last 72 hours.  Invalid input(s): "FREET3" Anemia work up No results for input(s): "VITAMINB12", "FOLATE", "  FERRITIN", "TIBC", "IRON", "RETICCTPCT" in the last 72 hours. Urinalysis    Component Value Date/Time   COLORURINE YELLOW 08/04/2019 1146   APPEARANCEUR CLEAR 08/04/2019 1146   LABSPEC 1.010 08/04/2019 1146   PHURINE 6.0 08/04/2019 1146   GLUCOSEU NEGATIVE 08/04/2019 1146   HGBUR NEGATIVE 08/04/2019 1146   BILIRUBINUR NEG 01/26/2014 1559   KETONESUR NEGATIVE 08/04/2019 1146   PROTEINUR NEGATIVE 08/04/2019 1146   UROBILINOGEN 0.2 01/26/2014 1559   NITRITE NEGATIVE 08/04/2019 1146   LEUKOCYTESUR TRACE (A) 08/04/2019 1146   Sepsis Labs Recent Labs  Lab 10/26/23 2002 10/27/23 0552  WBC 7.8 6.2   Microbiology No results found for this or any previous visit (from the past 240 hours).   Time coordinating discharge: 32 minutes  SIGNED:   Vada Garibaldi, MD  [facility] 10/29/2023, 9:30 AM

## 2023-11-02 ENCOUNTER — Ambulatory Visit: Admitting: Orthopedic Surgery

## 2023-11-02 DIAGNOSIS — W540XXD Bitten by dog, subsequent encounter: Secondary | ICD-10-CM | POA: Diagnosis not present

## 2023-11-02 DIAGNOSIS — S61451D Open bite of right hand, subsequent encounter: Secondary | ICD-10-CM

## 2023-11-02 NOTE — Progress Notes (Unsigned)
 Grace Russell - 33 y.o. female MRN [id number]  Date of birth: [birth year]  Office Visit Note: Visit Date: 11/02/2023 PCP: Ronna Coho, MD Referred by: Ronna Coho, MD  Subjective: No chief complaint on file.  HPI: [name] is a pleasant 33 y.o. female who presents today for wound check after a dog bite of the right hand that was managed with IV antibiotics and transition to oral antibiotics.  She has since been on the oral antibiotics after discharge for 3 days, states that her symptoms continue to improve, minimal pain, erythema has resolved to the right hand, range of motion continues to improve with the digits.  No ongoing drainage.  No fevers or systemic symptoms.  Pertinent ROS were reviewed with the patient and found to be negative unless otherwise specified above in HPI.    Assessment & Plan: Visit Diagnoses:  1. Dog bite of right hand, subsequent encounter     Plan: She is doing very well overall.  I recommended that she continue and complete her course of oral antibiotics as prescribed.  No indication for surgical procedure at this time.  She is welcome to return to me as needed, strict return precautions were discussed.  Patient expressed full understanding.  Follow-up: No follow-ups on file.   Meds & Orders: No orders of the defined types were placed in this encounter.  No orders of the defined types were placed in this encounter.    Procedures: No procedures performed      Clinical History: No specialty comments available.  She reports that she has never smoked. She has never used smokeless tobacco. No results for input(s): "HGBA1C", "LABURIC" in the last 8760 hours.  Objective:   Vital Signs: There were no vitals taken for this visit.  Physical Exam  Gen: Well-appearing, in no acute distress; non-toxic CV: Regular Rate. Well-perfused. Warm.  Resp: Breathing unlabored on room air; no wheezing. Psych: Fluid speech in conversation;  appropriate affect; normal thought process  Ortho Exam  Right upper Extremity   Minimal swelling throughout the dorsal aspect of the right hand, no tenderness to palpation, no appreciable warmth    Able to perform digital range of motion, full composite fist without restriction Sensation intact median/radial/ulnar distributions, AIN/PIN/interosseous intact   No significant erythema or warmth tracking up the forearm, no streaking  Imaging: No results found.  Past Medical/Family/Surgical/Social History: Medications & Allergies reviewed per EMR, new medications updated. Patient Active Problem List   Diagnosis Date Noted   Cellulitis and abscess of hand 10/28/2023   Cellulitis of hand 10/27/2023   Cellulitis 10/27/2023   Dog bite of right hand 10/27/2023   Dog bite 10/26/2023   Morbid obesity (HCC) 10/04/2018   Depression 07/11/2012   History of recurrent UTIs 07/11/2012   Past Medical History:  Diagnosis Date   Anxiety    Bipolar 2 disorder (HCC)    Depression    Family History  Problem Relation Age of Onset   Heart disease Father    Diabetes Father    Heart disease Paternal Grandfather    Past Surgical History:  Procedure Laterality Date   INTRAUTERINE DEVICE INSERTION  04/06/2019   LAPAROSCOPIC GASTRIC SLEEVE RESECTION  05/2021   WISDOM TOOTH EXTRACTION     Social History   Occupational History   Not on file  Tobacco Use   Smoking status: Never   Smokeless tobacco: Never  Vaping Use   Vaping status: Never Used  Substance and Sexual Activity  Alcohol use: Yes    Comment: 5-6 drinks a week   Drug use: No   Sexual activity: Yes    Partners: Male    Birth control/protection: I.U.D.    Comment: Mirena  (04/06/2019); First IC <16 y/o, >5 Partners    Grace Russell (Grace Russell) Grace Russell, M.D. [facility], Hand Surgery

## 2023-11-05 DIAGNOSIS — F322 Major depressive disorder, single episode, severe without psychotic features: Secondary | ICD-10-CM | POA: Diagnosis not present

## 2023-11-05 DIAGNOSIS — Z09 Encounter for follow-up examination after completed treatment for conditions other than malignant neoplasm: Secondary | ICD-10-CM | POA: Diagnosis not present

## 2023-11-08 DIAGNOSIS — F1020 Alcohol dependence, uncomplicated: Secondary | ICD-10-CM | POA: Diagnosis not present

## 2023-11-17 ENCOUNTER — Other Ambulatory Visit (HOSPITAL_COMMUNITY)
Admission: RE | Admit: 2023-11-17 | Discharge: 2023-11-17 | Disposition: A | Discharge status: Home / Self Care | Source: Ambulatory Visit | Attending: Nurse Practitioner | Admitting: Nurse Practitioner

## 2023-11-17 ENCOUNTER — Encounter: Payer: Self-pay | Admitting: Nurse Practitioner

## 2023-11-17 ENCOUNTER — Ambulatory Visit (INDEPENDENT_AMBULATORY_CARE_PROVIDER_SITE_OTHER): Admitting: Nurse Practitioner

## 2023-11-17 VITALS — BP 108/60 | HR 84 | Ht 63.75 in | Wt 217.0 lb

## 2023-11-17 DIAGNOSIS — Z01419 Encounter for gynecological examination (general) (routine) without abnormal findings: Secondary | ICD-10-CM | POA: Insufficient documentation

## 2023-11-17 DIAGNOSIS — Z1151 Encounter for screening for human papillomavirus (HPV): Secondary | ICD-10-CM | POA: Diagnosis not present

## 2023-11-17 DIAGNOSIS — Z1331 Encounter for screening for depression: Secondary | ICD-10-CM

## 2023-11-17 DIAGNOSIS — Z124 Encounter for screening for malignant neoplasm of cervix: Secondary | ICD-10-CM

## 2023-11-17 DIAGNOSIS — Z30431 Encounter for routine checking of intrauterine contraceptive device: Secondary | ICD-10-CM

## 2023-11-17 DIAGNOSIS — Z113 Encounter for screening for infections with a predominantly sexual mode of transmission: Secondary | ICD-10-CM

## 2023-11-17 NOTE — Progress Notes (Signed)
 Grace Russell [facility] 12-Jun-1991 [id number]   History:  33 y.o. G0 presents for annual exam. Mirena  IUD 03/2019. Has started having light monthly spotting with mild PMS symptoms. H/O heavy periods. Normal pap history. Received Gardasil series. Bipolar disorder managed by PCP and therapist. 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 Doing well. New relationship. Looking for employment.   Gynecologic History No LMP recorded. (Menstrual status: IUD).   Contraception/Family planning: IUD Sexually active: Yes  Health Maintenance Last Pap: 03/26/2021. Results were: Normal Last mammogram: Not indicated Last colonoscopy: Not indicated Last Dexa: Not indicated     11/17/2023    2:55 PM  Depression screen PHQ 2/9  Decreased Interest 1  Down, Depressed, Hopeless 1  PHQ - 2 Score 2  Altered sleeping 1  Tired, decreased energy 1  Change in appetite 0  Feeling bad or failure about yourself  0  Trouble concentrating 1  Moving slowly or fidgety/restless 0  Suicidal thoughts 0  PHQ-9 Score 5  Difficult doing work/chores Not difficult at all     Past medical history, past surgical history, family history and social history were all reviewed and documented in the EPIC chart. Works at [employer].   ROS:  A ROS was performed and pertinent positives and negatives are included.  Exam:  Vitals:   11/17/23 1440  BP: 108/60  Pulse: 84  SpO2: 99%  Weight: 217 lb (98.4 kg)  Height: 5' 3.75" (1.619 m)    Body mass index is 37.54 kg/m.  General appearance:  Normal Thyroid :  Symmetrical, normal in size, without palpable masses or nodularity. Respiratory  Auscultation:  Clear without wheezing or rhonchi Cardiovascular  Auscultation:  Regular rate, without rubs, murmurs or gallops  Edema/varicosities:  Not grossly evident Abdominal  Soft,nontender, without masses, guarding or rebound.  Liver/spleen:  No organomegaly noted  Hernia:  None appreciated   Skin  Inspection:  Grossly normal Breasts: Examined lying and sitting.   Right: Without masses, retractions, nipple discharge or axillary adenopathy.   Left: Without masses, retractions, nipple discharge or axillary adenopathy. Pelvic: External genitalia:  no lesions              Urethra:  normal appearing urethra with no masses, tenderness or lesions              Bartholins and Skenes: normal                 Vagina: normal appearing vagina with normal color and discharge, no lesions              Cervix: no lesions. + IUD strings Bimanual Exam:  Uterus:  no masses or tenderness              Adnexa: no mass, fullness, tenderness              Rectovaginal: Deferred              Anus:  normal, no lesions  Doria Garden, NP student present as chaperone.   Assessment/Plan:  33 y.o. G0 for annual exam.   Well female exam with routine gynecological exam - Education provided on SBEs, importance of preventative screenings, current guidelines, high calcium diet, regular exercise, and multivitamin daily.    Encounter for routine checking of intrauterine contraceptive device (IUD) - Mirena  inserted 03/2019. Reassured spotting is normal. Will monitor. If bleeding increases we discussed exchanging early.   Cervical cancer screening - Plan: Cytology - PAP( [place]). Normal  Pap history.  Pap today per guidelines.   Screening examination for STD (sexually transmitted disease) - Plan: Cytology - PAP( [place]). GC/CT/trich added to pap. Declines HIV/RPR.   Return in about 1 year (around 11/16/2024) for Annual.    Andee Bamberger DNP, 3:18 PM 11/17/2023

## 2023-11-19 ENCOUNTER — Ambulatory Visit: Payer: Self-pay | Admitting: Nurse Practitioner

## 2023-11-19 LAB — CYTOLOGY - PAP
Chlamydia: NEGATIVE
Comment: NEGATIVE
Comment: NEGATIVE
Comment: NEGATIVE
Comment: NORMAL
High risk HPV: POSITIVE — AB
Neisseria Gonorrhea: NEGATIVE
Trichomonas: NEGATIVE

## 2023-11-22 DIAGNOSIS — F1020 Alcohol dependence, uncomplicated: Secondary | ICD-10-CM | POA: Diagnosis not present

## 2023-12-08 DIAGNOSIS — F1020 Alcohol dependence, uncomplicated: Secondary | ICD-10-CM | POA: Diagnosis not present

## 2023-12-23 DIAGNOSIS — F1020 Alcohol dependence, uncomplicated: Secondary | ICD-10-CM | POA: Diagnosis not present

## 2024-01-05 DIAGNOSIS — F419 Anxiety disorder, unspecified: Secondary | ICD-10-CM | POA: Diagnosis not present

## 2024-01-05 DIAGNOSIS — F338 Other recurrent depressive disorders: Secondary | ICD-10-CM | POA: Diagnosis not present

## 2024-01-05 DIAGNOSIS — F4310 Post-traumatic stress disorder, unspecified: Secondary | ICD-10-CM | POA: Diagnosis not present

## 2024-01-05 DIAGNOSIS — F1021 Alcohol dependence, in remission: Secondary | ICD-10-CM | POA: Diagnosis not present

## 2024-01-05 DIAGNOSIS — Z5181 Encounter for therapeutic drug level monitoring: Secondary | ICD-10-CM | POA: Diagnosis not present

## 2024-01-05 DIAGNOSIS — Z79899 Other long term (current) drug therapy: Secondary | ICD-10-CM | POA: Diagnosis not present

## 2024-01-06 DIAGNOSIS — F1021 Alcohol dependence, in remission: Secondary | ICD-10-CM | POA: Diagnosis not present

## 2024-01-06 DIAGNOSIS — F338 Other recurrent depressive disorders: Secondary | ICD-10-CM | POA: Diagnosis not present

## 2024-01-06 DIAGNOSIS — F419 Anxiety disorder, unspecified: Secondary | ICD-10-CM | POA: Diagnosis not present

## 2024-01-06 DIAGNOSIS — F4310 Post-traumatic stress disorder, unspecified: Secondary | ICD-10-CM | POA: Diagnosis not present

## 2024-01-19 DIAGNOSIS — F1020 Alcohol dependence, uncomplicated: Secondary | ICD-10-CM | POA: Diagnosis not present

## 2024-01-25 DIAGNOSIS — F338 Other recurrent depressive disorders: Secondary | ICD-10-CM | POA: Diagnosis not present

## 2024-01-25 DIAGNOSIS — F4310 Post-traumatic stress disorder, unspecified: Secondary | ICD-10-CM | POA: Diagnosis not present

## 2024-01-25 DIAGNOSIS — F1021 Alcohol dependence, in remission: Secondary | ICD-10-CM | POA: Diagnosis not present

## 2024-01-25 DIAGNOSIS — F419 Anxiety disorder, unspecified: Secondary | ICD-10-CM | POA: Diagnosis not present

## 2024-01-26 DIAGNOSIS — R69 Illness, unspecified: Secondary | ICD-10-CM | POA: Diagnosis not present

## 2024-01-26 DIAGNOSIS — Z3202 Encounter for pregnancy test, result negative: Secondary | ICD-10-CM | POA: Diagnosis not present

## 2024-01-26 DIAGNOSIS — B9689 Other specified bacterial agents as the cause of diseases classified elsewhere: Secondary | ICD-10-CM | POA: Diagnosis not present

## 2024-01-26 DIAGNOSIS — N760 Acute vaginitis: Secondary | ICD-10-CM | POA: Diagnosis not present

## 2024-02-02 DIAGNOSIS — F1020 Alcohol dependence, uncomplicated: Secondary | ICD-10-CM | POA: Diagnosis not present

## 2024-02-29 DIAGNOSIS — F419 Anxiety disorder, unspecified: Secondary | ICD-10-CM | POA: Diagnosis not present

## 2024-02-29 DIAGNOSIS — F338 Other recurrent depressive disorders: Secondary | ICD-10-CM | POA: Diagnosis not present

## 2024-02-29 DIAGNOSIS — F900 Attention-deficit hyperactivity disorder, predominantly inattentive type: Secondary | ICD-10-CM | POA: Diagnosis not present

## 2024-02-29 DIAGNOSIS — F4310 Post-traumatic stress disorder, unspecified: Secondary | ICD-10-CM | POA: Diagnosis not present

## 2024-03-01 DIAGNOSIS — F1020 Alcohol dependence, uncomplicated: Secondary | ICD-10-CM | POA: Diagnosis not present

## 2024-03-16 DIAGNOSIS — F1020 Alcohol dependence, uncomplicated: Secondary | ICD-10-CM | POA: Diagnosis not present

## 2024-03-29 DIAGNOSIS — F1020 Alcohol dependence, uncomplicated: Secondary | ICD-10-CM | POA: Diagnosis not present

## 2024-04-24 ENCOUNTER — Encounter: Payer: Self-pay | Admitting: Radiology

## 2024-04-25 DIAGNOSIS — F1020 Alcohol dependence, uncomplicated: Secondary | ICD-10-CM | POA: Diagnosis not present

## 2024-05-24 DIAGNOSIS — F4310 Post-traumatic stress disorder, unspecified: Secondary | ICD-10-CM | POA: Diagnosis not present

## 2024-05-24 DIAGNOSIS — F1020 Alcohol dependence, uncomplicated: Secondary | ICD-10-CM | POA: Diagnosis not present

## 2024-05-24 DIAGNOSIS — F419 Anxiety disorder, unspecified: Secondary | ICD-10-CM | POA: Diagnosis not present

## 2024-05-24 DIAGNOSIS — F900 Attention-deficit hyperactivity disorder, predominantly inattentive type: Secondary | ICD-10-CM | POA: Diagnosis not present

## 2024-05-24 DIAGNOSIS — F338 Other recurrent depressive disorders: Secondary | ICD-10-CM | POA: Diagnosis not present

## 2024-06-07 DIAGNOSIS — F1020 Alcohol dependence, uncomplicated: Secondary | ICD-10-CM | POA: Diagnosis not present
